# Patient Record
Sex: Female | Born: 1970 | Race: White | Hispanic: No | Marital: Married | Smoking: Never smoker
Health system: Southern US, Community
[De-identification: ages and names within clinical notes are randomized; demographics above are authoritative.]

## PROBLEM LIST (undated history)

## (undated) DIAGNOSIS — R51 Headache: Secondary | ICD-10-CM

## (undated) DIAGNOSIS — C801 Malignant (primary) neoplasm, unspecified: Secondary | ICD-10-CM

## (undated) DIAGNOSIS — F419 Anxiety disorder, unspecified: Secondary | ICD-10-CM

## (undated) DIAGNOSIS — R109 Unspecified abdominal pain: Secondary | ICD-10-CM

## (undated) DIAGNOSIS — H9319 Tinnitus, unspecified ear: Secondary | ICD-10-CM

## (undated) HISTORY — DX: Anxiety disorder, unspecified: F41.9

## (undated) HISTORY — PX: CHOLECYSTECTOMY: SHX55

---
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
## 2004-12-07 HISTORY — PX: OTHER SURGICAL HISTORY: SHX169

## 2008-07-12 ENCOUNTER — Ambulatory Visit (HOSPITAL_COMMUNITY): Admission: RE | Admit: 2008-07-12 | Discharge: 2008-07-12 | Payer: Self-pay | Admitting: Family Medicine

## 2009-02-04 ENCOUNTER — Encounter: Admission: RE | Admit: 2009-02-04 | Discharge: 2009-02-04 | Payer: Self-pay | Admitting: Family Medicine

## 2009-06-23 ENCOUNTER — Ambulatory Visit (HOSPITAL_COMMUNITY): Admission: RE | Admit: 2009-06-23 | Discharge: 2009-06-23 | Payer: Self-pay | Admitting: Obstetrics and Gynecology

## 2010-08-13 ENCOUNTER — Encounter: Payer: Self-pay | Admitting: Family Medicine

## 2010-10-13 ENCOUNTER — Emergency Department (HOSPITAL_COMMUNITY): Payer: Federal, State, Local not specified - PPO

## 2010-10-13 ENCOUNTER — Emergency Department (HOSPITAL_COMMUNITY)
Admission: EM | Admit: 2010-10-13 | Discharge: 2010-10-13 | Disposition: A | Discharge status: Home / Self Care | Payer: Federal, State, Local not specified - PPO | Attending: Emergency Medicine | Admitting: Emergency Medicine

## 2010-10-13 DIAGNOSIS — R209 Unspecified disturbances of skin sensation: Secondary | ICD-10-CM | POA: Insufficient documentation

## 2010-10-13 DIAGNOSIS — J329 Chronic sinusitis, unspecified: Secondary | ICD-10-CM | POA: Insufficient documentation

## 2010-10-13 DIAGNOSIS — R51 Headache: Secondary | ICD-10-CM | POA: Insufficient documentation

## 2010-10-13 LAB — URINALYSIS, ROUTINE W REFLEX MICROSCOPIC
Bilirubin Urine: NEGATIVE
Hgb urine dipstick: NEGATIVE
Nitrite: NEGATIVE
Specific Gravity, Urine: 1.008 (ref 1.005–1.030)
Urobilinogen, UA: 0.2 mg/dL (ref 0.0–1.0)

## 2010-10-13 LAB — APTT: aPTT: 30 seconds (ref 24–37)

## 2010-10-13 LAB — CBC
HCT: 36.7 % (ref 36.0–46.0)
MCHC: 33.5 g/dL (ref 30.0–36.0)
Platelets: 196 10*3/uL (ref 150–400)
RDW: 12.1 % (ref 11.5–15.5)
WBC: 10 10*3/uL (ref 4.0–10.5)

## 2010-10-13 LAB — POCT PREGNANCY, URINE: Preg Test, Ur: NEGATIVE

## 2010-10-13 LAB — DIFFERENTIAL
Basophils Absolute: 0 10*3/uL (ref 0.0–0.1)
Basophils Relative: 0 % (ref 0–1)
Eosinophils Relative: 0 % (ref 0–5)
Lymphocytes Relative: 12 % (ref 12–46)
Monocytes Absolute: 0.3 10*3/uL (ref 0.1–1.0)

## 2010-10-13 LAB — BASIC METABOLIC PANEL
BUN: 6 mg/dL (ref 6–23)
Calcium: 9.3 mg/dL (ref 8.4–10.5)
Creatinine, Ser: 0.55 mg/dL (ref 0.4–1.2)
GFR calc non Af Amer: >60 mL/min (ref >60)
Glucose, Bld: 102 mg/dL — ABNORMAL HIGH (ref 70–99)

## 2010-10-13 LAB — PROTIME-INR
INR: 1 (ref 0.00–1.49)
Prothrombin Time: 13.4 seconds (ref 11.6–15.2)

## 2010-10-24 LAB — CBC
HCT: 39.4 % (ref 36.0–46.0)
Hemoglobin: 13.2 g/dL (ref 12.0–15.0)
MCHC: 33.6 g/dL (ref 30.0–36.0)
MCV: 101.1 fL — ABNORMAL HIGH (ref 78.0–100.0)
Platelets: 202 K/uL (ref 150–400)
RBC: 3.9 MIL/uL (ref 3.87–5.11)
RDW: 12.6 % (ref 11.5–15.5)
WBC: 8.8 K/uL (ref 4.0–10.5)

## 2010-10-24 LAB — ABO/RH: ABO/RH(D): A POS

## 2013-12-21 ENCOUNTER — Other Ambulatory Visit: Payer: Self-pay | Admitting: Obstetrics and Gynecology

## 2014-01-01 ENCOUNTER — Ambulatory Visit: Payer: Federal, State, Local not specified - PPO | Attending: Gynecology | Admitting: Gynecology

## 2014-01-01 ENCOUNTER — Encounter: Payer: Self-pay | Admitting: Gynecology

## 2014-01-01 ENCOUNTER — Encounter (HOSPITAL_COMMUNITY): Payer: Self-pay | Admitting: Pharmacy Technician

## 2014-01-01 VITALS — BP 142/78 | HR 96 | Temp 98.5°F | Resp 20 | Ht 67.0 in | Wt 145.6 lb

## 2014-01-01 DIAGNOSIS — C539 Malignant neoplasm of cervix uteri, unspecified: Secondary | ICD-10-CM | POA: Insufficient documentation

## 2014-01-01 DIAGNOSIS — R1032 Left lower quadrant pain: Secondary | ICD-10-CM

## 2014-01-01 DIAGNOSIS — R1011 Right upper quadrant pain: Secondary | ICD-10-CM

## 2014-01-01 DIAGNOSIS — F411 Generalized anxiety disorder: Secondary | ICD-10-CM | POA: Insufficient documentation

## 2014-01-01 NOTE — Patient Instructions (Addendum)
You will have a Robotic assisted Radical Hysterectomy  By Dr. Alycia Rossetti on January 12, 2014                Preparing for your Surgery  Pre-operative Testing -You will receive a phone call from presurgical testing at [facility] to arrange for a pre-operative testing appointment before your surgery.  This appointment normally occurs one to two weeks before your scheduled surgery.   -Bring your insurance card, copy of an advanced directive if applicable, medication list  -At that visit, you will be asked to sign a consent for a possible blood transfusion in case a transfusion becomes necessary during surgery.  The need for a blood transfusion is rare but having consent is a necessary part of your care.     Day Before Surgery at [facility] will be asked to take in only clear liquids the day before surgery.  Examples of clear liquids include broths, jello, and clear juices.  You will be advised to have nothing to eat or drink after midnight the evening before.    Your role in recovery Your role is to become active as soon as directed by your doctor, while still giving yourself time to heal.  Rest when you feel tired. You will be asked to do the following in order to speed your recovery:  - Cough and breathe deeply. This helps toclear and expand your lungs and can prevent pneumonia. You may be given a spirometer to practice deep breathing. A staff member will show you how to use the spirometer. - Do mild physical activity. Walking or moving your legs help your circulation and body functions return to normal. A staff member will help you when you try to walk and will provide you with simple exercises. Do not try to get up or walk alone the first time. - Actively manage your pain. Managing your pain lets you move in comfort. We will ask you to rate your pain on a scale of zero to 10. It is your responsibility to tell your doctor or nurse where and how much you hurt so your pain can be  treated.  Special Considerations -If you are diabetic, you may be placed on insulin after surgery to have closer control over your blood sugars to promote healing and recovery.  This does not mean that you will be discharged on insulin.  If applicable, your oral antidiabetics will be resumed when you are tolerating a solid diet.  -Your final pathology results from surgery should be available by the Friday after surgery and the results will be relayed to you when available. Hysterectomy Information  A hysterectomy is a surgery in which your uterus is removed. This surgery may be done to treat various medical problems. After the surgery, you will no longer have menstrual periods. The surgery will also make you unable to become pregnant (sterile). The fallopian tubes and ovaries can be removed (bilateral salpingo-oophorectomy) during this surgery as well.  REASONS FOR A HYSTERECTOMY  Persistent, abnormal bleeding.  Lasting (chronic) pelvic pain or infection.  The lining of the uterus (endometrium) starts growing outside the uterus (endometriosis).  The endometrium starts growing in the muscle of the uterus (adenomyosis).  The uterus falls down into the vagina (pelvic organ prolapse).  Noncancerous growths in the uterus (uterine fibroids) that cause symptoms.  Precancerous cells.  Cervical cancer or uterine cancer. TYPES OF HYSTERECTOMIES  Supracervical hysterectomy In this type, the top part of the uterus is removed, but  not the cervix.  Total hysterectomy The uterus and cervix are removed.  Radical hysterectomy The uterus, the cervix, and the fibrous tissue that holds the uterus in place in the pelvis (parametrium) are removed. WAYS A HYSTERECTOMY CAN BE PERFORMED  Abdominal hysterectomy A large surgical cut (incision) is made in the abdomen. The uterus is removed through this incision.  Vaginal hysterectomy An incision is made in the vagina. The uterus is removed through this  incision. There are no abdominal incisions.  Conventional laparoscopic hysterectomy Three or four small incisions are made in the abdomen. A thin, lighted tube with a camera (laparoscope) is inserted into one of the incisions. Other tools are put through the other incisions. The uterus is cut into small pieces. The small pieces are removed through the incisions, or they are removed through the vagina.  Laparoscopically assisted vaginal hysterectomy (LAVH) Three or four small incisions are made in the abdomen. Part of the surgery is performed laparoscopically and part vaginally. The uterus is removed through the vagina.  Robot-assisted laparoscopic hysterectomy A laparoscope and other tools are inserted into 3 or 4 small incisions in the abdomen. A computer-controlled device is used to give the surgeon a 3D image and to help control the surgical instruments. This allows for more precise movements of surgical instruments. The uterus is cut into small pieces and removed through the incisions or removed through the vagina. RISKS AND COMPLICATIONS  Possible complications associated with this procedure include:  Bleeding and risk of blood transfusion. Tell your health care provider if you do not want to receive any blood products.  Blood clots in the legs or lung.  Infection.  Injury to surrounding organs.  Problems or side effects related to anesthesia.  Conversion to an abdominal hysterectomy from one of the other techniques. WHAT TO EXPECT AFTER A HYSTERECTOMY  You will be given pain medicine.  You will need to have someone with you for the first 3 5 days after you go home.  You will need to follow up with your surgeon in 2 4 weeks after surgery to evaluate your progress.  You may have early menopause symptoms such as hot flashes, night sweats, and insomnia.  If you had a hysterectomy for a problem that was not cancer or not a condition that could lead to cancer, then you no longer need  Pap tests. However, even if you no longer need a Pap test, a regular exam is a good idea to make sure no other problems are starting. Document Released: 01/02/2001 Document Revised: 04/29/2013 Document Reviewed: 03/16/2013 [facility] Patient Information 2014 [place].

## 2014-01-01 NOTE — Progress Notes (Signed)
Consult Note: Gyn-Onc   Diamond Calhoun 43 y.o. female  Chief Complaint  Patient presents with  . 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  Plan: Management stage IB 1 adenocarcinoma of cervix discussed with the patient and her husband. The pros and cons of surgery versus radiation therapy were outlined. The patient prefers to undergo surgery. We would recommend a robotic assisted laparoscopic radical hysterectomy, bilateral salpingectomy, and pelvic lymphadenectomy. The risks of surgery including hemorrhage, infection, injury to adjacent viscera, thromboembolic complications, and anesthetic risks were outlined. They understand that should intermediate or high-risk features be found on final pathology that postoperative pelvic radiation therapy may be advised.   While I think it unlikely that some of the patient's symptoms are associated with her early cervix cancer, to evaluate the patient's right upper quadrant pain and left lower quadrant pain we'll obtain a preoperative CT scan of the chest abdomen and pelvis.  Patient and her husband understand that Dr. Nancy Marus will be the primary surgeon.  HPI: 43 year old white female seen in consultation request of Dr. Everlene Farrier regarding management of a newly diagnosed adenocarcinoma of cervix (villoglandular). The patient presented with profuse clear vaginal discharge. Initial Pap smear showed atypical glandular cells. Colposcopy with directed biopsies revealed a lesion at the squamocolumnar junction (circumferential). Biopsies revealed adenocarcinoma villoglandular type.  Patient reports she has regular cyclic menses although the duration has become shortened and the frequency increased in recent years. She is not using any oral contraceptives or any other hormones.  Obstetrical history gravida 1 (cesarean section). She's had no other gynecologic surgery. 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   Vitals: Blood pressure 142/78, pulse 96, temperature 98.5 F (36.9 C), temperature source Oral, resp. rate 20, height 5\' 7"  (1.702 m), weight 145 lb 9.6 oz (66.044 kg).  Physical Exam: General : The patient is a healthy woman in no acute distress.  HEENT: normocephalic, extraoccular movements normal; neck is supple without thyromegally  Lynphnodes: Supraclavicular and inguinal nodes not enlarged  Abdomen: Soft, non-tender, no ascites, no organomegally, no masses, no hernias  Pelvic:  EGBUS: Normal female  Vagina: Normal, no lesions  Urethra and Bladder: Normal, non-tender  Cervix: Is normal in size. There is a exophytic lesion circumferentially at the cervical os. This measures approximately 1.5 cm in diameter. Uterus: Anterior normal shape size and consistency Bi-manual examination: Non-tender; no adenxal masses or nodularity  Rectal: normal sphincter tone, no masses, no blood , there is no parametrial involvement. Lower extremities: No edema or varicosities. Normal range of motion      No Known Allergies  Past Medical History  Diagnosis Date  . Anxiety     Past Surgical History  Procedure Laterality Date  . Benign tumor removed in 1988  1988    Parotid tumor removed   . Cholecystectomy    . C-secton  12/07/2004    Current Outpatient Prescriptions  Medication Sig Dispense Refill  . ALPRAZolam (XANAX) 0.25 MG tablet        No current facility-administered medications for this visit.    History   Social History  . Marital Status: Married    Spouse Name: N/A    Number of Children: N/A  . Years of Education: N/A  Occupational History  . Not on file.   Social History Main Topics  . Smoking status: Former Research scientist (life sciences)  . Smokeless tobacco: Not on file     Comment: " Smoked some in  college"    . Alcohol Use: Yes     Comment: " Occasionally 1-2 glasses a year"   . Drug Use: No  . Sexual Activity: Yes    Birth Control/ Protection: Condom   Other Topics Concern  . Not on file   Social History Narrative  . No narrative on file    Family History  Problem Relation Age of Onset  . Anemia Brother   . Cancer Paternal Aunt   . Cancer Maternal Grandmother   . Cancer Maternal Grandfather   . Cancer Paternal Grandfather       Alvino Chapel, MD 01/01/2014, 12:13 PM

## 2014-01-01 NOTE — Progress Notes (Signed)
Need pre op orders please---has pst appt 01/04/14    thanks

## 2014-01-04 ENCOUNTER — Encounter (HOSPITAL_COMMUNITY): Payer: Self-pay

## 2014-01-04 ENCOUNTER — Ambulatory Visit (HOSPITAL_COMMUNITY)
Admission: RE | Admit: 2014-01-04 | Discharge: 2014-01-04 | Disposition: A | Discharge status: Home / Self Care | Payer: Federal, State, Local not specified - PPO | Source: Ambulatory Visit | Attending: Gynecology | Admitting: Gynecology

## 2014-01-04 ENCOUNTER — Encounter (HOSPITAL_COMMUNITY)
Admission: RE | Admit: 2014-01-04 | Discharge: 2014-01-04 | Disposition: A | Discharge status: Home / Self Care | Payer: Federal, State, Local not specified - PPO | Source: Ambulatory Visit | Attending: Obstetrics & Gynecology | Admitting: Obstetrics & Gynecology

## 2014-01-04 DIAGNOSIS — C539 Malignant neoplasm of cervix uteri, unspecified: Secondary | ICD-10-CM

## 2014-01-04 DIAGNOSIS — N852 Hypertrophy of uterus: Secondary | ICD-10-CM | POA: Insufficient documentation

## 2014-01-04 DIAGNOSIS — N859 Noninflammatory disorder of uterus, unspecified: Secondary | ICD-10-CM | POA: Insufficient documentation

## 2014-01-04 HISTORY — DX: Headache: R51

## 2014-01-04 HISTORY — DX: Malignant (primary) neoplasm, unspecified: C80.1

## 2014-01-04 HISTORY — DX: Tinnitus, unspecified ear: H93.19

## 2014-01-04 HISTORY — DX: Unspecified abdominal pain: R10.9

## 2014-01-04 LAB — COMPREHENSIVE METABOLIC PANEL
ALK PHOS: 39 U/L (ref 39–117)
ALT: 12 U/L (ref 0–35)
AST: 16 U/L (ref 0–37)
Albumin: 4 g/dL (ref 3.5–5.2)
BILIRUBIN TOTAL: 0.3 mg/dL (ref 0.3–1.2)
BUN: 7 mg/dL (ref 6–23)
CHLORIDE: 103 meq/L (ref 96–112)
CO2: 27 mEq/L (ref 19–32)
Calcium: 9.3 mg/dL (ref 8.4–10.5)
Creatinine, Ser: 0.53 mg/dL (ref 0.50–1.10)
GFR calc Af Amer: >90 mL/min (ref >90)
Glucose, Bld: 99 mg/dL (ref 70–99)
Potassium: 4.2 mEq/L (ref 3.7–5.3)
Sodium: 140 mEq/L (ref 137–147)
TOTAL PROTEIN: 7 g/dL (ref 6.0–8.3)

## 2014-01-04 LAB — CBC WITH DIFFERENTIAL/PLATELET
Basophils Absolute: 0 10*3/uL (ref 0.0–0.1)
Basophils Relative: 0 % (ref 0–1)
EOS ABS: 0.1 10*3/uL (ref 0.0–0.7)
Eosinophils Relative: 1 % (ref 0–5)
HCT: 34.7 % — ABNORMAL LOW (ref 36.0–46.0)
Hemoglobin: 12.3 g/dL (ref 12.0–15.0)
LYMPHS ABS: 1.8 10*3/uL (ref 0.7–4.0)
Lymphocytes Relative: 26 % (ref 12–46)
MCH: 33.9 pg (ref 26.0–34.0)
MCHC: 35.4 g/dL (ref 30.0–36.0)
MCV: 95.6 fL (ref 78.0–100.0)
MONOS PCT: 5 % (ref 3–12)
Monocytes Absolute: 0.4 10*3/uL (ref 0.1–1.0)
Neutro Abs: 4.6 10*3/uL (ref 1.7–7.7)
Neutrophils Relative %: 68 % (ref 43–77)
Platelets: 188 10*3/uL (ref 150–400)
RBC: 3.63 MIL/uL — AB (ref 3.87–5.11)
RDW: 12.2 % (ref 11.5–15.5)
WBC: 6.8 10*3/uL (ref 4.0–10.5)

## 2014-01-04 LAB — URINALYSIS, ROUTINE W REFLEX MICROSCOPIC
BILIRUBIN URINE: NEGATIVE
Glucose, UA: NEGATIVE mg/dL
HGB URINE DIPSTICK: NEGATIVE
KETONES UR: NEGATIVE mg/dL
Leukocytes, UA: NEGATIVE
Nitrite: NEGATIVE
PROTEIN: NEGATIVE mg/dL
Specific Gravity, Urine: 1.022 (ref 1.005–1.030)
UROBILINOGEN UA: 0.2 mg/dL (ref 0.0–1.0)
pH: 8 (ref 5.0–8.0)

## 2014-01-04 LAB — PREGNANCY, URINE: PREG TEST UR: NEGATIVE

## 2014-01-04 MED ORDER — IOHEXOL 300 MG/ML  SOLN
100.0000 mL | Freq: Once | INTRAMUSCULAR | Status: AC | PRN
  Administered 2014-01-04: 100 mL via INTRAVENOUS

## 2014-01-04 NOTE — Patient Instructions (Addendum)
Diamond Calhoun  01/04/2014                           YOUR PROCEDURE IS SCHEDULED ON: 01/12/14 AT 2:15 PM               ENTER THRU [facility] ENTRANCE AND                           FOLLOW  SIGNS TO SHORT STAY CENTER                 ARRIVE AT SHORT STAY AT: 12:15 PM               CALL THIS NUMBER IF ANY PROBLEMS THE DAY OF SURGERY :               [phone]                                REMEMBER:   Do not eat food  AFTER MIDNIGHT              CLEAR LIQUIDS ONLY DAY BEFORE SURGERY   May have clear liquids UNTIL 6 HOURS BEFORE SURGERY  (8:00 AM)               Take these medicines the morning of surgery with               A SIPS OF WATER :     XANAX IF NEEDED    Do not wear jewelry, make-up   Do not wear lotions, powders, or perfumes.   Do not shave legs or underarms 12 hrs. before surgery (men may shave face)  Do not bring valuables to the hospital.  Contacts, dentures or bridgework may not be worn into surgery.  Leave suitcase in the car. After surgery it may be brought to your room.  For patients admitted to the hospital more than one night, checkout time is            11:00 AM                                                            ________________________________________________________________________                                                                        [place]  Before surgery, you can play an important role.  Because skin is not sterile, your skin needs to be as free of germs as possible.  You can reduce the number of germs on your skin by washing with CHG (chlorahexidine gluconate) soap before surgery.  CHG is an antiseptic cleaner which kills germs and bonds with the skin to continue killing germs even after washing. Please DO NOT use if you have an allergy to CHG or antibacterial soaps.  If your skin becomes reddened/irritated stop using  the CHG and inform your nurse when you arrive at  Short Stay. Do not shave (including legs and underarms) for at least 48 hours prior to the first CHG shower.  You may shave your face. Please follow these instructions carefully:   1.  Shower with CHG Soap the night before surgery and the  morning of Surgery.   2.  If you choose to wash your hair, wash your hair first as usual with your  normal  Shampoo.   3.  After you shampoo, rinse your hair and body thoroughly to remove the  shampoo.                                         4.  Use CHG as you would any other liquid soap.  You can apply chg directly  to the skin and wash . Gently wash with scrungie or clean wascloth    5.  Apply the CHG Soap to your body ONLY FROM THE NECK DOWN.   Do not use on open                           Wound or open sores. Avoid contact with eyes, ears mouth and genitals (private parts).                        Genitals (private parts) with your normal soap.              6.  Wash thoroughly, paying special attention to the area where your surgery  will be performed.   7.  Thoroughly rinse your body with warm water from the neck down.   8.  DO NOT shower/wash with your normal soap after using and rinsing off  the CHG Soap .                9.  Pat yourself dry with a clean towel.             10.  Wear clean pajamas.             11.  Place clean sheets on your bed the night of your first shower and do not  sleep with pets.  Day of Surgery : Do not apply any lotions/deodorants the morning of surgery.  Please wear clean clothes to the hospital/surgery center.  FAILURE TO FOLLOW THESE INSTRUCTIONS MAY RESULT IN THE CANCELLATION OF YOUR SURGERY    PATIENT SIGNATURE_________________________________      CLEAR LIQUID DIET   Foods Allowed                                                                     Foods Excluded  Coffee and tea, regular and decaf                             liquids that you cannot  Plain Jell-O in any flavor  see through such as: Fruit ices (not with fruit pulp)                                     milk, soups, orange juice  Iced Popsicles                                    All solid food Carbonated beverages, regular and diet                                    Cranberry, grape and apple juices Sports drinks like Gatorade Lightly seasoned clear broth or consume(fat free) Sugar, honey syrup  _____________________________________________________________________    Incentive Spirometer  An incentive spirometer is a tool that can help keep your lungs clear and active. This tool measures how well you are filling your lungs with each breath. Taking long deep breaths may help reverse or decrease the chance of developing breathing (pulmonary) problems (especially infection) following:  A long period of time when you are unable to move or be active. BEFORE THE PROCEDURE   If the spirometer includes an indicator to show your best effort, your nurse or respiratory therapist will set it to a desired goal.  If possible, sit up straight or lean slightly forward. Try not to slouch.  Hold the incentive spirometer in an upright position. INSTRUCTIONS FOR USE  1. Sit on the edge of your bed if possible, or sit up as far as you can in bed or on a chair. 2. Hold the incentive spirometer in an upright position. 3. Breathe out normally. 4. Place the mouthpiece in your mouth and seal your lips tightly around it. 5. Breathe in slowly and as deeply as possible, raising the piston or the ball toward the top of the column. 6. Hold your breath for 3-5 seconds or for as long as possible. Allow the piston or ball to fall to the bottom of the column. 7. Remove the mouthpiece from your mouth and breathe out normally. 8. Rest for a few seconds and repeat Steps 1 through 7 at least 10 times every 1-2 hours when you are awake. Take your time and take a few normal breaths between deep breaths. 9. The  spirometer may include an indicator to show your best effort. Use the indicator as a goal to work toward during each repetition. 10. After each set of 10 deep breaths, practice coughing to be sure your lungs are clear. If you have an incision (the cut made at the time of surgery), support your incision when coughing by placing a pillow or rolled up towels firmly against it. Once you are able to get out of bed, walk around indoors and cough well. You may stop using the incentive spirometer when instructed by your caregiver.  RISKS AND COMPLICATIONS  Take your time so you do not get dizzy or light-headed.  If you are in pain, you may need to take or ask for pain medication before doing incentive spirometry. It is harder to take a deep breath if you are having pain. AFTER USE  Rest and breathe slowly and easily.  It can be helpful to keep track of a log of your progress. Your caregiver can provide you with a simple table to help  with this. If you are using the spirometer at home, follow these instructions: [place] IF:   You are having difficultly using the spirometer.  You have trouble using the spirometer as often as instructed.  Your pain medication is not giving enough relief while using the spirometer.  You develop fever of 100.5 F (38.1 C) or higher. SEEK IMMEDIATE MEDICAL CARE IF:   You cough up bloody sputum that had not been present before.  You develop fever of 102 F (38.9 C) or greater.  You develop worsening pain at or near the incision site. MAKE SURE YOU:   Understand these instructions.  Will watch your condition.  Will get help right away if you are not doing well or get worse. Document Released: 11/19/2006 Document Revised: 10/01/2011 Document Reviewed: 01/20/2007 [employer] Patient Information 2014 [employer], [place].   ________________________________________________________________________  WHAT IS A BLOOD TRANSFUSION? Blood Transfusion  Information  A transfusion is the replacement of blood or some of its parts. Blood is made up of multiple cells which provide different functions.  Red blood cells carry oxygen and are used for blood loss replacement.  White blood cells fight against infection.  Platelets control bleeding.  Plasma helps clot blood.  Other blood products are available for specialized needs, such as hemophilia or other clotting disorders. BEFORE THE TRANSFUSION  Who gives blood for transfusions?   Healthy volunteers who are fully evaluated to make sure their blood is safe. This is blood bank blood. Transfusion therapy is the safest it has ever been in the practice of medicine. Before blood is taken from a donor, a complete history is taken to make sure that person has no history of diseases nor engages in risky social behavior (examples are intravenous drug use or sexual activity with multiple partners). The donor's travel history is screened to minimize risk of transmitting infections, such as malaria. The donated blood is tested for signs of infectious diseases, such as HIV and hepatitis. The blood is then tested to be sure it is compatible with you in order to minimize the chance of a transfusion reaction. If you or a relative donates blood, this is often done in anticipation of surgery and is not appropriate for emergency situations. It takes many days to process the donated blood. RISKS AND COMPLICATIONS Although transfusion therapy is very safe and saves many lives, the main dangers of transfusion include:   Getting an infectious disease.  Developing a transfusion reaction. This is an allergic reaction to something in the blood you were given. Every precaution is taken to prevent this. The decision to have a blood transfusion has been considered carefully by your caregiver before blood is given. Blood is not given unless the benefits outweigh the risks. AFTER THE TRANSFUSION  Right after receiving a  blood transfusion, you will usually feel much better and more energetic. This is especially true if your red blood cells have gotten low (anemic). The transfusion raises the level of the red blood cells which carry oxygen, and this usually causes an energy increase.  The nurse administering the transfusion will monitor you carefully for complications. HOME CARE INSTRUCTIONS  No special instructions are needed after a transfusion. You may find your energy is better. Speak with your caregiver about any limitations on activity for underlying diseases you may have. SEEK MEDICAL CARE IF:   Your condition is not improving after your transfusion.  You develop redness or irritation at the intravenous (IV) site. SEEK IMMEDIATE MEDICAL CARE IF:  Any of the following symptoms occur over the next 12 hours:  Shaking chills.  You have a temperature by mouth above 102 F (38.9 C), not controlled by medicine.  Chest, back, or muscle pain.  People around you feel you are not acting correctly or are confused.  Shortness of breath or difficulty breathing.  Dizziness and fainting.  You get a rash or develop hives.  You have a decrease in urine output.  Your urine turns a dark color or changes to pink, red, or brown. Any of the following symptoms occur over the next 10 days:  You have a temperature by mouth above 102 F (38.9 C), not controlled by medicine.  Shortness of breath.  Weakness after normal activity.  The white part of the eye turns yellow (jaundice).  You have a decrease in the amount of urine or are urinating less often.  Your urine turns a dark color or changes to pink, red, or brown. Document Released: 07/06/2000 Document Revised: 10/01/2011 Document Reviewed: 02/23/2008 [facility] Patient Information 2014 [place], [place].  _______________________________________________________________________

## 2014-01-05 ENCOUNTER — Telehealth (INDEPENDENT_AMBULATORY_CARE_PROVIDER_SITE_OTHER): Payer: Self-pay | Admitting: Gynecologic Oncology

## 2014-01-05 NOTE — Telephone Encounter (Signed)
TC to patient. We discussed her CT scan results. She was very pleased. We discussed the fibroids and that at the time of either EUA or dx laparoscopy, the uterus was felt to be too big for MIS radical hysterectomy, that we would proceed with surgery via a low transverse incision. She is fine with that. We discussed ovarian preservation but removal of the fallopian tubes. She understands that there is a small risk of ovarian involvement with adenoca of the cervix but that the benefits to maintaining ovarian function at her age outweight those risks. If there are risk factors at the time of her final pathology, we can always remove the ovaries. She agrees. Her questions were answered and she is prepared for surgery next week. PG

## 2014-01-07 NOTE — Progress Notes (Signed)
PT NOTIFIED OF TIME CHANGE TO 3:00 PM - INSTRUCTED TO ARRIVE AT 1:00 PM - MAY HAVE CLEAR LIQUIDS UNTIL 7:00 AM

## 2014-01-12 ENCOUNTER — Encounter (HOSPITAL_COMMUNITY): Payer: Federal, State, Local not specified - PPO | Admitting: Certified Registered"

## 2014-01-12 ENCOUNTER — Encounter (HOSPITAL_COMMUNITY)
Admission: RE | Disposition: A | Discharge status: Home / Self Care | Payer: Self-pay | Source: Ambulatory Visit | Attending: Gynecologic Oncology

## 2014-01-12 ENCOUNTER — Inpatient Hospital Stay (HOSPITAL_COMMUNITY)
Admission: RE | Admit: 2014-01-12 | Discharge: 2014-01-14 | DRG: 735 | Disposition: A | Discharge status: Home / Self Care | Payer: Federal, State, Local not specified - PPO | Source: Ambulatory Visit | Attending: Gynecologic Oncology | Admitting: Gynecologic Oncology

## 2014-01-12 ENCOUNTER — Ambulatory Visit (HOSPITAL_COMMUNITY): Payer: Federal, State, Local not specified - PPO | Admitting: Certified Registered"

## 2014-01-12 ENCOUNTER — Encounter (HOSPITAL_COMMUNITY): Payer: Self-pay | Admitting: *Deleted

## 2014-01-12 DIAGNOSIS — C539 Malignant neoplasm of cervix uteri, unspecified: Secondary | ICD-10-CM

## 2014-01-12 DIAGNOSIS — F411 Generalized anxiety disorder: Secondary | ICD-10-CM | POA: Diagnosis present

## 2014-01-12 DIAGNOSIS — R110 Nausea: Secondary | ICD-10-CM | POA: Diagnosis not present

## 2014-01-12 DIAGNOSIS — Z79899 Other long term (current) drug therapy: Secondary | ICD-10-CM

## 2014-01-12 HISTORY — PX: ROBOTIC ASSISTED TOTAL HYSTERECTOMY WITH BILATERAL SALPINGO OOPHERECTOMY: SHX6086

## 2014-01-12 LAB — TYPE AND SCREEN
ABO/RH(D): A POS
Antibody Screen: NEGATIVE

## 2014-01-12 LAB — ABO/RH: ABO/RH(D): A POS

## 2014-01-12 SURGERY — ROBOTIC ASSISTED TOTAL HYSTERECTOMY WITH BILATERAL SALPINGO OOPHORECTOMY
Anesthesia: General

## 2014-01-12 MED ORDER — NAPHAZOLINE HCL 0.1 % OP SOLN
1.0000 [drp] | OPHTHALMIC | Status: DC | PRN
  Filled 2014-01-12: qty 15

## 2014-01-12 MED ORDER — MIDAZOLAM HCL 2 MG/2ML IJ SOLN
INTRAMUSCULAR | Status: AC
  Filled 2014-01-12: qty 2

## 2014-01-12 MED ORDER — HYDROMORPHONE HCL PF 1 MG/ML IJ SOLN
INTRAMUSCULAR | Status: AC
  Filled 2014-01-12: qty 1

## 2014-01-12 MED ORDER — GLYCOPYRROLATE 0.2 MG/ML IJ SOLN
INTRAMUSCULAR | Status: AC
  Filled 2014-01-12: qty 3

## 2014-01-12 MED ORDER — ALPRAZOLAM 0.25 MG PO TABS: 0.1250 mg | ORAL_TABLET | Freq: Two times a day (BID) | ORAL | Status: DC | PRN

## 2014-01-12 MED ORDER — ROCURONIUM BROMIDE 100 MG/10ML IV SOLN
INTRAVENOUS | Status: AC
  Filled 2014-01-12: qty 1

## 2014-01-12 MED ORDER — PROPOFOL 10 MG/ML IV BOLUS
INTRAVENOUS | Status: DC | PRN
  Administered 2014-01-12: 150 mg via INTRAVENOUS

## 2014-01-12 MED ORDER — DEXAMETHASONE SODIUM PHOSPHATE 10 MG/ML IJ SOLN
INTRAMUSCULAR | Status: DC | PRN
  Administered 2014-01-12: 10 mg via INTRAVENOUS

## 2014-01-12 MED ORDER — GLYCOPYRROLATE 0.2 MG/ML IJ SOLN
INTRAMUSCULAR | Status: DC | PRN
  Administered 2014-01-12: .5 mg via INTRAVENOUS

## 2014-01-12 MED ORDER — STERILE WATER FOR IRRIGATION IR SOLN
Status: DC | PRN
  Administered 2014-01-12: 3000 mL

## 2014-01-12 MED ORDER — ENOXAPARIN SODIUM 40 MG/0.4ML ~~LOC~~ SOLN
40.0000 mg | SUBCUTANEOUS | Status: AC
  Administered 2014-01-12: 40 mg via SUBCUTANEOUS
  Filled 2014-01-12: qty 0.4

## 2014-01-12 MED ORDER — FENTANYL CITRATE 0.05 MG/ML IJ SOLN
INTRAMUSCULAR | Status: AC
  Filled 2014-01-12: qty 5

## 2014-01-12 MED ORDER — FENTANYL CITRATE 0.05 MG/ML IJ SOLN
INTRAMUSCULAR | Status: DC | PRN
Start: 2014-01-12 — End: 2014-01-12
  Administered 2014-01-12: 50 ug via INTRAVENOUS
  Administered 2014-01-12: 100 ug via INTRAVENOUS
  Administered 2014-01-12 (×2): 50 ug via INTRAVENOUS

## 2014-01-12 MED ORDER — MEPERIDINE HCL 50 MG/ML IJ SOLN
INTRAMUSCULAR | Status: AC
  Filled 2014-01-12: qty 1

## 2014-01-12 MED ORDER — PROPOFOL 10 MG/ML IV BOLUS
INTRAVENOUS | Status: AC
  Filled 2014-01-12: qty 20

## 2014-01-12 MED ORDER — LACTATED RINGERS IV SOLN: INTRAVENOUS | Status: DC

## 2014-01-12 MED ORDER — SUCCINYLCHOLINE CHLORIDE 20 MG/ML IJ SOLN
INTRAMUSCULAR | Status: DC | PRN
  Administered 2014-01-12: 80 mg via INTRAVENOUS

## 2014-01-12 MED ORDER — ONDANSETRON HCL 4 MG/2ML IJ SOLN
4.0000 mg | Freq: Four times a day (QID) | INTRAMUSCULAR | Status: DC | PRN
  Administered 2014-01-12 – 2014-01-13 (×2): 4 mg via INTRAVENOUS
  Filled 2014-01-12: qty 2

## 2014-01-12 MED ORDER — NEOSTIGMINE METHYLSULFATE 10 MG/10ML IV SOLN
INTRAVENOUS | Status: AC
  Filled 2014-01-12: qty 1

## 2014-01-12 MED ORDER — MEPERIDINE HCL 50 MG/ML IJ SOLN
6.2500 mg | INTRAMUSCULAR | Status: DC | PRN
  Administered 2014-01-12: 12.5 mg via INTRAVENOUS

## 2014-01-12 MED ORDER — OXYCODONE-ACETAMINOPHEN 5-325 MG PO TABS: 1.0000 | ORAL_TABLET | ORAL | Status: DC | PRN

## 2014-01-12 MED ORDER — LACTATED RINGERS IV SOLN
INTRAVENOUS | Status: DC
  Administered 2014-01-12: 1000 mL via INTRAVENOUS

## 2014-01-12 MED ORDER — CEFAZOLIN SODIUM-DEXTROSE 2-3 GM-% IV SOLR
2.0000 g | INTRAVENOUS | Status: AC
  Administered 2014-01-12: 2 g via INTRAVENOUS

## 2014-01-12 MED ORDER — ONDANSETRON HCL 4 MG/2ML IJ SOLN
INTRAMUSCULAR | Status: DC | PRN
  Administered 2014-01-12: 4 mg via INTRAVENOUS

## 2014-01-12 MED ORDER — HYDROMORPHONE HCL PF 2 MG/ML IJ SOLN
INTRAMUSCULAR | Status: AC
  Filled 2014-01-12: qty 1

## 2014-01-12 MED ORDER — KETOROLAC TROMETHAMINE 30 MG/ML IJ SOLN
INTRAMUSCULAR | Status: AC
  Filled 2014-01-12: qty 1

## 2014-01-12 MED ORDER — KETOROLAC TROMETHAMINE 30 MG/ML IJ SOLN
30.0000 mg | Freq: Four times a day (QID) | INTRAMUSCULAR | Status: AC
Start: 2014-01-12 — End: 2014-01-13
  Filled 2014-01-12 (×3): qty 1

## 2014-01-12 MED ORDER — ROCURONIUM BROMIDE 100 MG/10ML IV SOLN
INTRAVENOUS | Status: DC | PRN
  Administered 2014-01-12 (×2): 10 mg via INTRAVENOUS
  Administered 2014-01-12: 30 mg via INTRAVENOUS
  Administered 2014-01-12: 5 mg via INTRAVENOUS
  Administered 2014-01-12: 10 mg via INTRAVENOUS

## 2014-01-12 MED ORDER — NEOSTIGMINE METHYLSULFATE 10 MG/10ML IV SOLN
INTRAVENOUS | Status: DC | PRN
  Administered 2014-01-12: 3.5 mg via INTRAVENOUS

## 2014-01-12 MED ORDER — CEFAZOLIN SODIUM-DEXTROSE 2-3 GM-% IV SOLR
INTRAVENOUS | Status: AC
  Filled 2014-01-12: qty 50

## 2014-01-12 MED ORDER — PROMETHAZINE HCL 25 MG/ML IJ SOLN: 6.2500 mg | INTRAMUSCULAR | Status: DC | PRN

## 2014-01-12 MED ORDER — KCL IN DEXTROSE-NACL 20-5-0.45 MEQ/L-%-% IV SOLN
INTRAVENOUS | Status: DC
  Administered 2014-01-12 – 2014-01-13 (×2): via INTRAVENOUS
  Filled 2014-01-12 (×4): qty 1000

## 2014-01-12 MED ORDER — MIDAZOLAM HCL 5 MG/5ML IJ SOLN
INTRAMUSCULAR | Status: DC | PRN
Start: 2014-01-12 — End: 2014-01-12
  Administered 2014-01-12: 2 mg via INTRAVENOUS

## 2014-01-12 MED ORDER — ONDANSETRON HCL 4 MG PO TABS: 4.0000 mg | ORAL_TABLET | Freq: Four times a day (QID) | ORAL | Status: DC | PRN

## 2014-01-12 MED ORDER — ONDANSETRON HCL 4 MG/2ML IJ SOLN
INTRAMUSCULAR | Status: AC
  Filled 2014-01-12: qty 2

## 2014-01-12 MED ORDER — HYDROMORPHONE HCL PF 1 MG/ML IJ SOLN
INTRAMUSCULAR | Status: DC | PRN
  Administered 2014-01-12 (×4): .4 mg via INTRAVENOUS

## 2014-01-12 MED ORDER — LACTATED RINGERS IR SOLN
Status: DC | PRN
  Administered 2014-01-12: 1000 mL

## 2014-01-12 MED ORDER — KETOROLAC TROMETHAMINE 30 MG/ML IJ SOLN
30.0000 mg | Freq: Four times a day (QID) | INTRAMUSCULAR | Status: AC
Start: 2014-01-12 — End: 2014-01-13
  Administered 2014-01-12 – 2014-01-13 (×4): 30 mg via INTRAVENOUS
  Filled 2014-01-12: qty 1
  Filled 2014-01-12: qty 2
  Filled 2014-01-12: qty 1
  Filled 2014-01-12: qty 2
  Filled 2014-01-12: qty 1

## 2014-01-12 MED ORDER — KCL IN DEXTROSE-NACL 20-5-0.45 MEQ/L-%-% IV SOLN
INTRAVENOUS | Status: AC
  Filled 2014-01-12: qty 1000

## 2014-01-12 MED ORDER — LIDOCAINE HCL (PF) 2 % IJ SOLN
INTRAMUSCULAR | Status: DC | PRN
  Administered 2014-01-12: 20 mg via INTRADERMAL

## 2014-01-12 MED ORDER — HYDROMORPHONE HCL PF 1 MG/ML IJ SOLN
0.2500 mg | INTRAMUSCULAR | Status: DC | PRN
Start: 2014-01-12 — End: 2014-01-12
  Administered 2014-01-12 (×4): 0.5 mg via INTRAVENOUS

## 2014-01-12 MED ORDER — DEXAMETHASONE SODIUM PHOSPHATE 10 MG/ML IJ SOLN
INTRAMUSCULAR | Status: AC
  Filled 2014-01-12: qty 1

## 2014-01-12 SURGICAL SUPPLY — 60 items
BENZOIN TINCTURE PRP APPL 2/3 (GAUZE/BANDAGES/DRESSINGS) ×3 IMPLANT
CABLE HIGH FREQUENCY MONO STRZ (ELECTRODE) ×3 IMPLANT
CHLORAPREP W/TINT 26ML (MISCELLANEOUS) ×3 IMPLANT
CLOSURE WOUND 1/2 X4 (GAUZE/BANDAGES/DRESSINGS) ×2
CORDS BIPOLAR (ELECTRODE) ×3 IMPLANT
COVER MAYO STAND STRL (DRAPES) IMPLANT
COVER SURGICAL LIGHT HANDLE (MISCELLANEOUS) ×3 IMPLANT
COVER TIP SHEARS 8 DVNC (MISCELLANEOUS) ×1 IMPLANT
COVER TIP SHEARS 8MM DA VINCI (MISCELLANEOUS) ×2
DRAPE LG THREE QUARTER DISP (DRAPES) ×6 IMPLANT
DRAPE SURG IRRIG POUCH 19X23 (DRAPES) IMPLANT
DRAPE TABLE BACK 44X90 PK DISP (DRAPES) ×6 IMPLANT
DRAPE UTILITY XL STRL (DRAPES) IMPLANT
DRAPE WARM FLUID 44X44 (DRAPE) ×3 IMPLANT
DRSG TEGADERM 2-3/8X2-3/4 SM (GAUZE/BANDAGES/DRESSINGS) ×15 IMPLANT
DRSG TEGADERM 4X4.75 (GAUZE/BANDAGES/DRESSINGS) IMPLANT
DRSG TEGADERM 6X8 (GAUZE/BANDAGES/DRESSINGS) IMPLANT
DRSG TEGADERM 8X12 (GAUZE/BANDAGES/DRESSINGS) ×3 IMPLANT
ELECT REM PT RETURN 9FT ADLT (ELECTROSURGICAL) ×3
ELECTRODE REM PT RTRN 9FT ADLT (ELECTROSURGICAL) ×1 IMPLANT
GAUZE SPONGE 2X2 8PLY STRL LF (GAUZE/BANDAGES/DRESSINGS) ×1 IMPLANT
GLOVE BIO SURGEON STRL SZ 6.5 (GLOVE) ×16 IMPLANT
GLOVE BIO SURGEONS STRL SZ 6.5 (GLOVE) ×8
GLOVE BIOGEL PI IND STRL 7.0 (GLOVE) ×2 IMPLANT
GLOVE BIOGEL PI INDICATOR 7.0 (GLOVE) ×4
GOWN STRL REUS W/ TWL XL LVL3 (GOWN DISPOSABLE) ×1 IMPLANT
GOWN STRL REUS W/TWL LRG LVL3 (GOWN DISPOSABLE) ×15 IMPLANT
GOWN STRL REUS W/TWL XL LVL3 (GOWN DISPOSABLE) ×2
HOLDER FOLEY CATH W/STRAP (MISCELLANEOUS) ×3 IMPLANT
KIT ACCESSORY DA VINCI DISP (KITS) ×2
KIT ACCESSORY DVNC DISP (KITS) ×1 IMPLANT
KIT BASIN OR (CUSTOM PROCEDURE TRAY) ×3 IMPLANT
MANIPULATOR UTERINE 4.5 ZUMI (MISCELLANEOUS) ×3 IMPLANT
MARKER SKIN DUAL TIP RULER LAB (MISCELLANEOUS) ×3 IMPLANT
OCCLUDER COLPOPNEUMO (BALLOONS) ×3 IMPLANT
POUCH SPECIMEN RETRIEVAL 10MM (ENDOMECHANICALS) ×6 IMPLANT
SET TUBE IRRIG SUCTION NO TIP (IRRIGATION / IRRIGATOR) ×3 IMPLANT
SHEET LAVH (DRAPES) ×3 IMPLANT
SOLUTION ANTI FOG 6CC (MISCELLANEOUS) ×3 IMPLANT
SOLUTION ELECTROLUBE (MISCELLANEOUS) ×3 IMPLANT
SPONGE GAUZE 2X2 STER 10/PKG (GAUZE/BANDAGES/DRESSINGS) ×2
SPONGE LAP 18X18 X RAY DECT (DISPOSABLE) IMPLANT
STRIP CLOSURE SKIN 1/2X4 (GAUZE/BANDAGES/DRESSINGS) ×4 IMPLANT
SUT VIC AB 0 CT1 27 (SUTURE) ×8
SUT VIC AB 0 CT1 27XBRD ANTBC (SUTURE) ×4 IMPLANT
SUT VIC AB 1 CT1 27 (SUTURE) ×2
SUT VIC AB 1 CT1 27XBRD ANTBC (SUTURE) ×1 IMPLANT
SUT VIC AB 4-0 PS2 27 (SUTURE) ×6 IMPLANT
SUT VICRYL 0 UR6 27IN ABS (SUTURE) ×3 IMPLANT
SYR BULB IRRIGATION 50ML (SYRINGE) IMPLANT
SYRINGE 60CC LL (MISCELLANEOUS) ×3 IMPLANT
TOWEL OR 17X26 10 PK STRL BLUE (TOWEL DISPOSABLE) ×6 IMPLANT
TOWEL OR NON WOVEN STRL DISP B (DISPOSABLE) ×3 IMPLANT
TRAP SPECIMEN MUCOUS 40CC (MISCELLANEOUS) IMPLANT
TRAY FOLEY CATH 14FRSI W/METER (CATHETERS) ×3 IMPLANT
TRAY LAP CHOLE (CUSTOM PROCEDURE TRAY) ×3 IMPLANT
TROCAR 12M 150ML BLUNT (TROCAR) ×3 IMPLANT
TROCAR BLADELESS OPT 5 100 (ENDOMECHANICALS) ×3 IMPLANT
TROCAR XCEL 12X100 BLDLESS (ENDOMECHANICALS) ×3 IMPLANT
TUBING INSUFFLATION 10FT LAP (TUBING) ×3 IMPLANT

## 2014-01-12 NOTE — Anesthesia Preprocedure Evaluation (Addendum)
Anesthesia Evaluation  Patient identified by MRN, date of birth, ID band Patient awake    Reviewed: Allergy & Precautions, H&P , NPO status , Patient's Chart, lab work & pertinent test results  Airway Mallampati: II TM Distance: >3 FB Neck ROM: Full    Dental  (+) Teeth Intact, Dental Advisory Given   Pulmonary neg pulmonary ROS,  breath sounds clear to auscultation  Pulmonary exam normal       Cardiovascular negative cardio ROS  Rhythm:Regular Rate:Normal     Neuro/Psych negative neurological ROS  negative psych ROS   GI/Hepatic negative GI ROS, Neg liver ROS,   Endo/Other  negative endocrine ROS  Renal/GU negative Renal ROS  negative genitourinary   Musculoskeletal negative musculoskeletal ROS (+)   Abdominal   Peds negative pediatric ROS (+)  Hematology negative hematology ROS (+)   Anesthesia Other Findings   Reproductive/Obstetrics negative OB ROS                          Anesthesia Physical Anesthesia Plan  ASA: II  Anesthesia Plan: General   Post-op Pain Management:    Induction: Intravenous  Airway Management Planned: Oral ETT  Additional Equipment:   Intra-op Plan:   Post-operative Plan: Extubation in OR  Informed Consent: I have reviewed the patients History and Physical, chart, labs and discussed the procedure including the risks, benefits and alternatives for the proposed anesthesia with the patient or authorized representative who has indicated his/her understanding and acceptance.   Dental advisory given  Plan Discussed with: CRNA  Anesthesia Plan Comments:         Anesthesia Quick Evaluation

## 2014-01-12 NOTE — H&P (View-Only) (Signed)
Consult Note: Gyn-Onc   Guy A Szwed 43 y.o. female  Chief Complaint  Patient presents with  . 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  Plan: Management stage IB 1 adenocarcinoma of cervix discussed with the patient and her husband. The pros and cons of surgery versus radiation therapy were outlined. The patient prefers to undergo surgery. We would recommend a robotic assisted laparoscopic radical hysterectomy, bilateral salpingectomy, and pelvic lymphadenectomy. The risks of surgery including hemorrhage, infection, injury to adjacent viscera, thromboembolic complications, and anesthetic risks were outlined. They understand that should intermediate or high-risk features be found on final pathology that postoperative pelvic radiation therapy may be advised.   While I think it unlikely that some of the patient's symptoms are associated with her early cervix cancer, to evaluate the patient's right upper quadrant pain and left lower quadrant pain we'll obtain a preoperative CT scan of the chest abdomen and pelvis.  Patient and her husband understand that Dr. Nancy Marus will be the primary surgeon.  HPI: 43 year old white female seen in consultation request of Dr. Everlene Farrier regarding management of a newly diagnosed adenocarcinoma of cervix (villoglandular). The patient presented with profuse clear vaginal discharge. Initial Pap smear showed atypical glandular cells. Colposcopy with directed biopsies revealed a lesion at the squamocolumnar junction (circumferential). Biopsies revealed adenocarcinoma villoglandular type.  Patient reports she has regular cyclic menses although the duration has become shortened and the frequency increased in recent years. She is not using any oral contraceptives or any other hormones.  Obstetrical history gravida 1 (cesarean section). She's had no other gynecologic surgery. 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   Vitals: Blood pressure 142/78, pulse 96, temperature 98.5 F (36.9 C), temperature source Oral, resp. rate 20, height 5\' 7"  (1.702 m), weight 145 lb 9.6 oz (66.044 kg).  Physical Exam: General : The patient is a healthy woman in no acute distress.  HEENT: normocephalic, extraoccular movements normal; neck is supple without thyromegally  Lynphnodes: Supraclavicular and inguinal nodes not enlarged  Abdomen: Soft, non-tender, no ascites, no organomegally, no masses, no hernias  Pelvic:  EGBUS: Normal female  Vagina: Normal, no lesions  Urethra and Bladder: Normal, non-tender  Cervix: Is normal in size. There is a exophytic lesion circumferentially at the cervical os. This measures approximately 1.5 cm in diameter. Uterus: Anterior normal shape size and consistency Bi-manual examination: Non-tender; no adenxal masses or nodularity  Rectal: normal sphincter tone, no masses, no blood , there is no parametrial involvement. Lower extremities: No edema or varicosities. Normal range of motion      No Known Allergies  Past Medical History  Diagnosis Date  . Anxiety     Past Surgical History  Procedure Laterality Date  . Benign tumor removed in 1988  1988    Parotid tumor removed   . Cholecystectomy    . C-secton  12/07/2004    Current Outpatient Prescriptions  Medication Sig Dispense Refill  . ALPRAZolam (XANAX) 0.25 MG tablet        No current facility-administered medications for this visit.    History   Social History  . Marital Status: Married    Spouse Name: N/A    Number of Children: N/A  . Years of Education: N/A  Occupational History  . Not on file.   Social History Main Topics  . Smoking status: Former Research scientist (life sciences)  . Smokeless tobacco: Not on file     Comment: " Smoked some in  college"    . Alcohol Use: Yes     Comment: " Occasionally 1-2 glasses a year"   . Drug Use: No  . Sexual Activity: Yes    Birth Control/ Protection: Condom   Other Topics Concern  . Not on file   Social History Narrative  . No narrative on file    Family History  Problem Relation Age of Onset  . Anemia Brother   . Cancer Paternal Aunt   . Cancer Maternal Grandmother   . Cancer Maternal Grandfather   . Cancer Paternal Grandfather       Alvino Chapel, MD 01/01/2014, 12:13 PM

## 2014-01-12 NOTE — Interval H&P Note (Signed)
History and Physical Interval Note:  01/12/2014 1:03 PM  Diamond Calhoun  has presented today for surgery, with the diagnosis of CERVICAL VILLOGLANDULAR ADENOCARCINOMA  The various methods of treatment have been discussed with the patient and family. After consideration of risks, benefits and other options for treatment, the patient has consented to  Procedure(s): ROBOTIC ASSISTED LAPAROSCOPIC RADICAL HYSECTOMEY BILATERAL SALPINGECTOMY AND PELVIC LYMPHADENECTOMY (N/A) as a surgical intervention .  The patient's history has been reviewed, patient examined, no change in status, stable for surgery.  I have reviewed the patient's chart and labs.  Questions were answered to the patient's satisfaction.     [place] A.

## 2014-01-12 NOTE — Transfer of Care (Signed)
Immediate Anesthesia Transfer of Care Note  Patient: Diamond Calhoun  Procedure(s) Performed: Procedure(s): ROBOTIC ASSISTED LAPAROSCOPIC RADICAL HYSECTOMEY BILATERAL SALPINGECTOMY AND PELVIC LYMPHADENECTOMY (N/A)  Patient Location: PACU  Anesthesia Type:General  Level of Consciousness: awake, alert , oriented and patient cooperative  Airway & Oxygen Therapy: Patient Spontanous Breathing and Patient connected to face mask oxygen  Post-op Assessment: Report given to PACU RN, Post -op Vital signs reviewed and stable and Patient moving all extremities  Post vital signs: Reviewed and stable  Complications: No apparent anesthesia complications

## 2014-01-12 NOTE — Op Note (Signed)
PATIENT: Diamond Calhoun DATE OF BIRTH: 12-07-1970 ENCOUNTER DATE: 01/12/14   Preop Diagnosis: IB adenocarcinoma of the cervix  Postoperative Diagnosis: same.   Surgery: Total robotic radical hysterectomy, bilateral salpingectomy, bilateral oophorectomy, bilateral pelvic lymph node dissection.   Surgeons:  Lucita Lora. Alycia Rossetti, MD; Lahoma Crocker, MD; Everitt Amber, MD  Anesthesia: General   Estimated blood loss: 100 ml   IVF: 1800 ml   Urine output: 355 ml   Complications: None   Pathology: Uterus, cervix, bilateral tubes, bilateral pelvic lymph nodes to pathology.   Operative findings: 1.5 cm cervical lesion, no adhesive disease,, no obvious lymphadenopathy.  Operative findings: At the time of exam under anesthesia. The cervix was 4 cm in size with a 1.5 cm lesion on the posterior lip of the cervix. The uterus was freely mobile though slightly globular. On rectovaginal examination the parametria were free. There were no pathologically enlarged lymph nodes.   Normal appearing ovaries and fallopian tubes  Procedure: The patient was identified in the preoperative holding area. Informed consent was signed on the chart. Patient was seen history was reviewed and exam was performed. Urine pregnancy test was negative.  The patient was then taken to the operating room and placed in the supine position with SCD hose on. She was then placed in the dorsolithotomy position. Her arms were tucked at her side with appropriate precautions on the gel pad. General anesthesia was then induced without difficulty. Shoulder supports were then placed in the usual fashion with appropriate precautions. A OG-tube was placed to suction. First timeout was performed to confirm the patient, procedure, antibiotic, allergy status, estimated blood loss and OR time. The perineum was then prepped in the usual fashion with Betadine. A 14 French Foley was inserted into the bladder under sterile conditions. The ZUMI with a  large Koh ring and a vaginal length marker was inserted into the vagina without difficulty. The abdomen was then prepped with 1 Chlor prep sponge per protocol.   Patient was then draped after the prep was dried. Second timeout was performed to confirm the above. After again confirming OG tube placement and it was to suction. A stab-wound was made in left upper quadrant 2 cm below the costal margin on the left in the midclavicular line. A 5 mm operative report was used to assure intra-abdominal placement. The abdomen was insufflated. At this point all points during the procedure the patient's intra-abdominal pressure was not increased over 15 mm of mercury. After insufflation was complete, the patient was placed in deep Trendelenburg position. 20 cm above the pubic symphysis that area was marked the camera port, this corresponded to the umbilicus. Bilateral robotic ports were marked 10 cm from the midline incision at approximately 5 angle. The fourth arm was marked 2 cm superior and medial to the ACIS. Under direct visualization each of the trochars was placed into the abdomen. The small bowel was folded on its mesentery to allow visualization to the pelvis. The 5 mm LUQ port was then converted to a 10/12 port under direct visualization. After assuring adequate visualization, the robot was then docked in the usual fashion. Under direct visualization the robotic instruments replaced.   The round ligament on the patient's right side was transected with monopolar cautery. The posterior leaf of the broad ligament was then taken down in the usual fashion. The paravesical and pararectal spaces were opened. The uterine artery was skeletonized and cauterized with bipolar cautery. It was then transected with monopolar cautery. The ureter was  identified on the medial leaf of the broad ligament. It was freed from its medial attachments to the peritoneum. In order to free the peritoneum, our attention was then drawn to the  fallopian tube on the right. Using bipolar cautery a salpingectomy was performed. A window was then made between the IP and the ureter. The utero-ovarian artery was coagulated with bipolar cautery and transected. This allowed the ovary to be elevated into the superior aspect of the abdomen. The posterior leaf of the broad ligament was taken down to the level of the uterosacral ligaments.   Using the [facility] and the vaginal length marker, we were able to delineate the posterior margin. The space between the vagina and the rectum was opened. There was some fibrosis in this area making it slightly more difficult. However, we were able to skeletonize the uterosacral ligaments. We proceeded laterally and the uterine artery and vein were brought over the ureter. We continued our dissection the level of the bladder. Anteriorly the bladder flap was created. There was minimal fibrosis and adhesive disease in this area. Again, using the vaginal length marker,  we were able to delineate the anterior vaginal margin. We continued skeletonizing the ureter anteriorly. The lateral bladder pillars were taken down, identified and cauterized with bipolar cautery. There were then transected. This allowed [place] to completely mobilized the bladder inferiorly and the ureter was taken inferiorly and laterally. Once we were pleased with the anterior vaginal margin, the same procedure was performed on the patient's left side.   Once the left side was completed we were able to mobilize the bladder anteriorly to achieve a 2 cm margin. On the posterior aspect again there was some fibrosis however we were able to dissect the rectum off of the vagina. The uterosacrals were coagulated with bipolar cautery and transected. The small amount of bleeding from the left uterosacral. This was controlled with bipolar cautery. The ureter was well lateral of this area of dissection. The pneumo occluded balloon was then insufflated. Using monopolar cautery the  colpotomy was performed. The specimen was delivered to the vagina without difficulty. I inspected the specimen and there was an adequate circumferential margin. The pneumo occluded balloon was replaced into the vagina.   The obturator nerve was identified on the right side. The nodal bundle extending over the common iliac artery down to the external iliac artery and the circumflex iliac vein was taken down using sharp dissection and monopolar cautery. The genitofemoral nerve was identified and spared. We continued our dissection down the external iliac vein to the level of the obturator nerve. The nodal bundle superior to the obturator nerve was taken. All pedicles were noted to be hemostatic. The ureter was noted to be well medial of the area of dissection. The nodal bundle was then placed and an Endo catch bag. The same procedure was performed on the left side. The nodal bundles were placed in Endo catch bag and delivered through the vagina.   The vaginal cuff was closed with a running 0 Vicryl on CT 1 suture starting from each angle and tied individually in the middle of the vaginal cuff. Our attention was then directed towards performing the oophoropexy. Using a suture of 0 Vicryl on CT 1 the ovary was tacked to the lateral peritoneum as high as possible. We ensured there is no twisting of the vascular supply and that there was not too much tension on the blood supply. A similar procedure was performed on the left.  The abdomen and pelvis were copiously irrigated and noted to be hemostatic. The robotic instruments were removed under direct visualization as were the robotic trochars. The pneumoperitoneum was removed. The patient was then taken out of the Trendelenburg position. Using of 0 Vicryl on a UR 6 needle the midline port fascia was closed after being grasped with allis clamps. The subcutaneous tissues of the port in the left upper quadrant was reapproximated. The skin was closed using 4-0 Vicryl.  Steri-Strips and benzoin were applied. The pneumo occluded balloon was removed from the vagina. The vagina was swabbed and noted to be hemostatic.    All instrument needle and Ray-Tec counts were correct x2. The patient tolerated the procedure well and was taken to the recovery room in stable condition. This is [facility] dictating an operative note on patient [place].

## 2014-01-13 ENCOUNTER — Encounter (HOSPITAL_COMMUNITY): Payer: Self-pay | Admitting: Gynecologic Oncology

## 2014-01-13 LAB — CBC
HCT: 31.3 % — ABNORMAL LOW (ref 36.0–46.0)
HEMOGLOBIN: 11 g/dL — AB (ref 12.0–15.0)
MCH: 33.3 pg (ref 26.0–34.0)
MCHC: 35.1 g/dL (ref 30.0–36.0)
MCV: 94.8 fL (ref 78.0–100.0)
PLATELETS: 148 10*3/uL — AB (ref 150–400)
RBC: 3.3 MIL/uL — AB (ref 3.87–5.11)
RDW: 12.3 % (ref 11.5–15.5)
WBC: 12.6 10*3/uL — ABNORMAL HIGH (ref 4.0–10.5)

## 2014-01-13 LAB — BASIC METABOLIC PANEL
BUN: 7 mg/dL (ref 6–23)
CO2: 24 mEq/L (ref 19–32)
Calcium: 9 mg/dL (ref 8.4–10.5)
Chloride: 101 mEq/L (ref 96–112)
Creatinine, Ser: 0.53 mg/dL (ref 0.50–1.10)
GFR calc Af Amer: >90 mL/min (ref >90)
GFR calc non Af Amer: >90 mL/min (ref >90)
Glucose, Bld: 147 mg/dL — ABNORMAL HIGH (ref 70–99)
POTASSIUM: 4.1 meq/L (ref 3.7–5.3)
SODIUM: 137 meq/L (ref 137–147)

## 2014-01-13 MED ORDER — IBUPROFEN 600 MG PO TABS
600.0000 mg | ORAL_TABLET | Freq: Four times a day (QID) | ORAL | Status: DC | PRN
  Administered 2014-01-13 – 2014-01-14 (×2): 600 mg via ORAL
  Filled 2014-01-13 (×2): qty 1

## 2014-01-13 MED ORDER — ENOXAPARIN SODIUM 40 MG/0.4ML ~~LOC~~ SOLN
40.0000 mg | SUBCUTANEOUS | Status: DC
Start: 2014-01-13 — End: 2014-01-14
  Administered 2014-01-13 – 2014-01-14 (×2): 40 mg via SUBCUTANEOUS
  Filled 2014-01-13 (×2): qty 0.4

## 2014-01-13 NOTE — Progress Notes (Signed)
Patient ID: CORAL TIMME, female   DOB: [birth year], 43 y.o.   MRN: [id number] 1 Day Post-Op Procedure(s) (LRB): ROBOTIC ASSISTED LAPAROSCOPIC RADICAL HYSECTOMEY BILATERAL SALPINGECTOMY AND PELVIC LYMPHADENECTOMY (N/A)  Subjective: Patient reports nausea.    Objective: Vital signs in last 24 hours: Temp:  [97.3 F (36.3 C)-98.5 F (36.9 C)] 98.5 F (36.9 C) (06/24 0527) Pulse Rate:  [50-102] 60 (06/24 0527) Resp:  [8-20] 16 (06/24 0527) BP: (97-135)/(52-83) 112/72 mmHg (06/24 0527) SpO2:  [99 %-100 %] 100 % (06/24 0527) Weight:  [64.411 kg (142 lb)] 64.411 kg (142 lb) (06/23 2300)    Intake/Output from previous day: 06/23 0701 - 06/24 0700 In: 4302.5 [P.O.:720; I.V.:3582.5] Out: 1330 [Urine:1230; Blood:100]  Physical Examination: General: alert Resp: clear to auscultation bilaterally Cardio: regular rate and rhythm, S1, S2 normal, no murmur, click, rub or gallop GI: soft, non-tender; bowel sounds normal; no masses,  no organomegaly Extremities: extremities normal, atraumatic, no cyanosis or edema and Homans sign is negative, no sign of DVT Vaginal Bleeding: none  Labs: WBC/Hgb/Hct/Plts:  12.6/11.0/31.3/148 (06/24 0350) BUN/Cr/glu/ALT/AST/amyl/lip:  7/0.53/--/--/--/--/-- (06/24 0938)   Assessment:  43 y.o. s/p Procedure(s): ROBOTIC ASSISTED LAPAROSCOPIC RADICAL HYSECTOMEY BILATERAL SALPINGECTOMY AND PELVIC LYMPHADENECTOMY: stable Pain:  Pain is well-controlled on or oral medications.  GI:  Tolerating liquids.  Likely residual anesthetic effect  FEN: Electrolytes in range  Prophylaxis: intermittent pneumatic compression boots.  Lovenox while inpatient.  Plan: Encourage ambulation Diet as tolerated Check CBC    LOS: 1 day    JACKSON-MOORE,LISA A 01/13/2014, 9:57 AM

## 2014-01-13 NOTE — Care Management Note (Signed)
    Page 1 of 1   01/13/2014     10:48:28 AM CARE MANAGEMENT NOTE 01/13/2014  Patient:  Diamond Calhoun, Diamond Calhoun   Account Number:  [id number]  Date Initiated:  01/13/2014  Documentation initiated by:  Sunday Spillers  Subjective/Objective Assessment:   43 yo female admitted s/p robotic hysterectomy. PTA live at hom with spouse.     Action/Plan:   Home when stable   Anticipated DC Date:  01/15/2014   Anticipated DC Plan:  [place]  CM consult      Choice offered to / List presented to:             Status of service:  Completed, signed off Medicare Important Message given?  NO (If response is "NO", the following Medicare IM given date fields will be blank) Date Medicare IM given:   Date Additional Medicare IM given:    Discharge Disposition:  HOME/SELF CARE  Per UR Regulation:  Reviewed for med. necessity/level of care/duration of stay  If discussed at [place] of Stay Meetings, dates discussed:    Comments:

## 2014-01-14 MED ORDER — ONDANSETRON HCL 4 MG PO TABS: 4.0000 mg | ORAL_TABLET | Freq: Four times a day (QID) | ORAL | Status: DC | PRN

## 2014-01-14 MED ORDER — OXYCODONE-ACETAMINOPHEN 5-325 MG PO TABS: 1.0000 | ORAL_TABLET | ORAL | Status: DC | PRN

## 2014-01-14 MED ORDER — ENOXAPARIN SODIUM 40 MG/0.4ML ~~LOC~~ SOLN: 40.0000 mg | SUBCUTANEOUS | Status: DC

## 2014-01-14 NOTE — Discharge Summary (Signed)
Physician Discharge Summary  Patient ID: EKATERINI CAPITANO MRN: [id number] DOB/AGE: 04-12-1971 43 y.o.  Admit date: 01/12/2014 Discharge date: 01/14/2014  Admission Diagnoses: cervical cancer  Discharge Diagnoses:  Active Problems:   Cervical cancer   Discharged Condition: good  Hospital Course: Louna was admitted to the hospital on 01/12/14 for a robotic assisted radical hysterectomy, bilateral salpingectomy and bilateral lymphadenectomy for a stage IB adenocarcinoma of the cervix. Her surgery was uneventful and uncomplicated. She experienced postoperative nausea on POD 1 which limited PO intake and this delayed discharge until its resolution on POD 2. Her foley was continued postop for 1 week due to the radical parametrial resection and concern for urinary retention.  Consults: None  Significant Diagnostic Studies: labs: Hb appropriate postop  Treatments: IV hydration and surgery: see above  Discharge Exam: Blood pressure 100/65, pulse 82, temperature 98.3 F (36.8 C), temperature source Oral, resp. rate 16, height 5\' 7"  (1.702 m), weight 142 lb (64.411 kg), last menstrual period 01/02/2014, SpO2 98.00%. General appearance: alert Eyes: conjunctivae/corneas clear. PERRL, EOM's intact. Fundi benign. Neck: no adenopathy, no carotid bruit, no JVD, supple, symmetrical, trachea midline and thyroid not enlarged, symmetric, no tenderness/mass/nodules Back: symmetric, no curvature. ROM normal. No CVA tenderness. Resp: clear to auscultation bilaterally Chest wall: no tenderness Cardio: regular rate and rhythm, S1, S2 normal, no murmur, click, rub or gallop GI: soft, non-tender; bowel sounds normal; no masses,  no organomegaly Pelvic: vagina normal without discharge Extremities: extremities normal, atraumatic, no cyanosis or edema Incision/Wound:  Disposition: 01-Home or Self Care  Discharge Instructions   (HEART FAILURE PATIENTS) Call MD:  Anytime you have any of the following  symptoms: 1) 3 pound weight gain in 24 hours or 5 pounds in 1 week 2) shortness of breath, with or without a dry hacking cough 3) swelling in the hands, feet or stomach 4) if you have to sleep on extra pillows at night in order to breathe.    Complete by:  As directed      Call MD for:  difficulty breathing, headache or visual disturbances    Complete by:  As directed      Call MD for:  extreme fatigue    Complete by:  As directed      Call MD for:  hives    Complete by:  As directed      Call MD for:  persistant dizziness or light-headedness    Complete by:  As directed      Call MD for:  persistant nausea and vomiting    Complete by:  As directed      Call MD for:  redness, tenderness, or signs of infection (pain, swelling, redness, odor or green/yellow discharge around incision site)    Complete by:  As directed      Call MD for:  severe uncontrolled pain    Complete by:  As directed      Call MD for:  temperature >100.4    Complete by:  As directed      Diet - low sodium heart healthy    Complete by:  As directed      Diet general    Complete by:  As directed      Discontinue IV    Complete by:  As directed      Driving Restrictions    Complete by:  As directed   No driving for 7 days or until off narcotic pain medication     Increase activity slowly  Complete by:  As directed      Remove dressing in 24 hours    Complete by:  As directed      Sexual Activity Restrictions    Complete by:  As directed   No intercourse for 6 weeks            Medication List         ALPRAZolam 0.25 MG tablet  Commonly known as:  XANAX  Take 0.125 mg by mouth 2 (two) times daily as needed for anxiety.     enoxaparin 40 MG/0.4ML injection  Commonly known as:  LOVENOX  Inject 0.4 mLs (40 mg total) into the skin daily.     ibuprofen 200 MG tablet  Commonly known as:  ADVIL,MOTRIN  Take 400 mg by mouth every 6 (six) hours as needed (Pain).     ondansetron 4 MG tablet  Commonly known  as:  ZOFRAN  Take 1 tablet (4 mg total) by mouth every 6 (six) hours as needed for nausea.     oxyCODONE-acetaminophen 5-325 MG per tablet  Commonly known as:  PERCOCET/ROXICET  Take 1-2 tablets by mouth every 4 (four) hours as needed for severe pain (moderate to severe pain (when tolerating fluids)).     tetrahydrozoline 0.05 % ophthalmic solution  Place 1 drop into both eyes as needed (allergies).           Follow-up Information   Follow up with Zipporah Plants, RN On 01/19/2014. (For catheter removal)    Contact information:   - - - - [place] [phone]       Signed: Donaciano Eva 01/14/2014, 10:55 AM

## 2014-01-14 NOTE — Progress Notes (Signed)
Patient ID: Diamond Calhoun, female   DOB: 1971-01-07, 43 y.o.   MRN: [id number] 2 Days Post-Op Procedure(s) (LRB): ROBOTIC ASSISTED LAPAROSCOPIC RADICAL HYSECTOMEY BILATERAL SALPINGECTOMY AND PELVIC LYMPHADENECTOMY (N/A)  Subjective: Patient reports improved nausea. She does have some shoulder tip pain (right) which is mild and some adominal bloating. + flatus.  Pain well controlled with motrin.  Objective: Vital signs in last 24 hours: Temp:  [98.3 F (36.8 C)-99.4 F (37.4 C)] 98.3 F (36.8 C) (06/25 0557) Pulse Rate:  [71-82] 82 (06/25 0557) Resp:  [16-18] 16 (06/25 0557) BP: (91-106)/(56-73) 100/65 mmHg (06/25 0557) SpO2:  [98 %-100 %] 98 % (06/25 0557)    Intake/Output from previous day: 06/24 0701 - 06/25 0700 In: 2880 [P.O.:1080; I.V.:1800] Out: 3150 [Urine:3150]  Physical Examination: General: alert Resp: clear to auscultation bilaterally Cardio: regular rate and rhythm, S1, S2 normal, no murmur, click, rub or gallop GI: soft, non-tender; bowel sounds normal; no masses,  no organomegaly Extremities: extremities normal, atraumatic, no cyanosis or edema and Homans sign is negative, no sign of DVT Vaginal Bleeding: none  Labs:     Hb was appropriate yesterday on POD 1.   Assessment:  43 y.o. s/p Procedure(s): ROBOTIC ASSISTED LAPAROSCOPIC RADICAL HYSECTOMEY BILATERAL SALPINGECTOMY AND PELVIC LYMPHADENECTOMY: stable Pain:  Pain is well-controlled on or oral medications.  GI:  Tolerating PO, nausea resolved  FEN: Electrolytes in range  GU: Foley in situ, and patient will be discharged with foley due to radical parametrial resection and risk for urinary retention. Will plan for foley removal in office 1 week postop.  Prophylaxis: intermittent pneumatic compression boots.  Lovenox while inpatient.  Plan: Encourage ambulation Discharge today to home with plan for followup in office for foley removal in 1 week. Donaciano Eva, MD  Check CBC    LOS: 2  days    Donaciano Eva 01/14/2014, 10:30 AM

## 2014-01-14 NOTE — Discharge Summary (Signed)
01/14/2014  Return to work: 4 weeks  Activity: 1. Be up and out of the bed during the day.  Take a nap if needed.  You may walk up steps but be careful and use the hand rail.  Stair climbing will tire you more than you think, you may need to stop part way and rest.   2. No lifting or straining for 6 weeks.  3. No driving for 1-2 weeks.  Do Not drive if you are taking narcotic pain medicine.  4. Shower daily.  Use soap and water on your incision and pat dry; don't rub.   5. No sexual activity and nothing in the vagina for 6 weeks.  Diet: 1. Low sodium Heart Healthy Diet is recommended.  2. It is safe to use a laxative if you have difficulty moving your bowels.   Wound Care: 1. Keep clean and dry.  Shower daily.  Reasons to call the Doctor:   Fever - Oral temperature greater than 100.4 degrees Fahrenheit  Foul-smelling vaginal discharge  Difficulty urinating or pain and discomfort in bladder  Nausea and vomiting  Increased pain at the site of the incision that is unrelieved with pain medicine.  Difficulty breathing with or without chest pain  New calf pain especially if only on one side  Sudden, continuing increased vaginal bleeding with or without clots.   Follow-up: 1. See Armanda Magic on 01/19/14 for foley catheter removal   2. See Dr. Alycia Rossetti or Dr Denman George in approximately 1 month.  Contacts: For questions or concerns you should contact:  Dr. Denman George at [phone]

## 2014-01-14 NOTE — Progress Notes (Signed)
Nurse reviewed discharge instructions with pt and husband.  Pt verbalized understanding of new medications, discharge instructions and follow up appointment for catheter removal.  Pt successfully self administered a dose of lovenox to lower left abdomen before discharge as she was given prescription for Lovenox.  No concerns at time of discharge.

## 2014-01-14 NOTE — Anesthesia Postprocedure Evaluation (Signed)
Anesthesia Post Note  Patient: Diamond Calhoun  Procedure(s) Performed: Procedure(s) (LRB): ROBOTIC ASSISTED LAPAROSCOPIC RADICAL HYSECTOMEY BILATERAL SALPINGECTOMY AND PELVIC LYMPHADENECTOMY (N/A)  Anesthesia type: General  Patient location: PACU  Post pain: Pain level controlled  Post assessment: Post-op Vital signs reviewed  Last Vitals:  Filed Vitals:   01/14/14 0557  BP: 100/65  Pulse: 82  Temp: 36.8 C  Resp: 16    Post vital signs: Reviewed  Level of consciousness: sedated  Complications: No apparent anesthesia complications

## 2014-01-18 ENCOUNTER — Encounter: Payer: Self-pay | Admitting: Gynecology

## 2014-01-18 ENCOUNTER — Ambulatory Visit: Payer: Federal, State, Local not specified - PPO

## 2014-01-18 ENCOUNTER — Ambulatory Visit: Payer: Federal, State, Local not specified - PPO | Attending: Gynecology | Admitting: Gynecology

## 2014-01-18 ENCOUNTER — Telehealth: Payer: Self-pay | Admitting: *Deleted

## 2014-01-18 ENCOUNTER — Other Ambulatory Visit: Payer: Self-pay | Admitting: *Deleted

## 2014-01-18 VITALS — BP 129/88 | HR 97 | Temp 99.7°F | Resp 20 | Ht 67.0 in

## 2014-01-18 DIAGNOSIS — Z90710 Acquired absence of both cervix and uterus: Secondary | ICD-10-CM | POA: Insufficient documentation

## 2014-01-18 DIAGNOSIS — E8779 Other fluid overload: Secondary | ICD-10-CM

## 2014-01-18 DIAGNOSIS — N390 Urinary tract infection, site not specified: Secondary | ICD-10-CM

## 2014-01-18 DIAGNOSIS — C539 Malignant neoplasm of cervix uteri, unspecified: Secondary | ICD-10-CM

## 2014-01-18 DIAGNOSIS — Z79899 Other long term (current) drug therapy: Secondary | ICD-10-CM | POA: Insufficient documentation

## 2014-01-18 LAB — URINALYSIS, MICROSCOPIC - CHCC
Bilirubin (Urine): NEGATIVE
Glucose: NEGATIVE mg/dL
Ketones: NEGATIVE mg/dL
NITRITE: NEGATIVE
PH: 8 (ref 4.6–8.0)
Protein: <30 mg/dL
Specific Gravity, Urine: 1.005 (ref 1.003–1.035)
UROBILINOGEN UR: 0.2 mg/dL (ref 0.2–1)

## 2014-01-18 NOTE — Patient Instructions (Signed)
Begin Septra DS 1 tablet twice a day.

## 2014-01-18 NOTE — Telephone Encounter (Signed)
Returned pt's call, pt states " it's burning like when I have had a bladder infection int he past, the urine has a foul odor & it's cloudy. Informed pt to come in to give urine specimen as she may have a bladder infection. Appt at 1:15pm, pt complains of area black in color on Sunday, feeling of fluid draining from abdominal area to vagina area, with tightness in legs/below hips. Reviewed with Dr. Wayland Salinas who advised pt to come in for UA/Culture and will evaluate

## 2014-01-18 NOTE — Progress Notes (Signed)
Consult Note: Gyn-Onc   Diamond Calhoun 43 y.o. female  No chief complaint on file.   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 Postoperative fluid retention. Flank and vulvar hematoma (suggestive of a retroperitoneal bleed). Probable urinary tract infection.  Plan:  . Patient's pathology report is reviewed and is favorable. No further therapy is necessary.  Patient has a hematoma on the vulva and left flank suggestive of retroperitoneal bleed. The patient is reassured that this will reabsorb.  She also has bilateral thigh fluid retention most likely secondary to intravenous fluids during surgery. I expect this to resolve spontaneously.. Symptoms suggestive of a urinary tract infection. Urine cultures sent today. We will empirically start the patient on Septra DS for 5 days.  HPI: 43 year old white female seen in consultation request of Dr. Everlene Farrier regarding management of a newly diagnosed adenocarcinoma of cervix (villoglandular). The patient presented with profuse clear vaginal discharge. Initial Pap smear showed atypical glandular cells. Colposcopy with directed biopsies revealed a lesion at the squamocolumnar junction (circumferential). Biopsies revealed adenocarcinoma villoglandular type.  Patient reports she has regular cyclic menses although the duration has become shortened and the frequency increased in recent years. She is not using any oral contraceptives or any other hormones.  Obstetrical history gravida 1 (cesarean section). She's had no other gynecologic surgery. 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 Final pathology showed negative margins and negative nodes  Review of Systems:10 point review of systems is negative except as noted in interval history.   Vitals: Blood  pressure 129/88, pulse 97, temperature 99.7 F (37.6 C), temperature source Oral, resp. rate 20, height 5\' 7"  (1.702 m), last menstrual period 01/03/2014.  Physical Exam: General : The patient is a healthy woman in no acute distress.  HEENT: normocephalic, extraoccular movements normal; neck is supple without thyromegally  Lynphnodes: Supraclavicular and inguinal nodes not enlarged  Abdomen: Soft, non-tender, no ascites, no organomegally, no masses, no hernias , there is a ecchymotic area originating near the left iliac crest and extending into the flank. All of the surgical wound sites are healing well. Pelvic:  EGBUS: The vulva is ecchymotic with more edema on the left.  There is no CVA tenderness.       No Known Allergies  Past Medical History  Diagnosis Date  . Anxiety   . Cancer     cervical ca dx'd 2015  . Headache(784.0)     MIGRAINES  . Tinnitus   . Abdominal pain, recurrent     RUQ  AROUND TO BACK    Past Surgical History  Procedure Laterality Date  . Benign tumor removed in 1988  1988    Parotid tumor removed   . Cholecystectomy    . C-secton  12/07/2004  . Robotic assisted total hysterectomy with bilateral salpingo oopherectomy N/A 01/12/2014    Procedure: ROBOTIC ASSISTED LAPAROSCOPIC RADICAL HYSECTOMEY BILATERAL SALPINGECTOMY AND PELVIC LYMPHADENECTOMY;  Surgeon: Imagene Gurney A. Alycia Rossetti, MD;  Location: [facility];  Service: Gynecology;  Laterality: N/A;    Current Outpatient Prescriptions  Medication Sig Dispense Refill  . ALPRAZolam (XANAX) 0.25 MG tablet Take 0.125 mg by mouth 2 (two) times daily as needed for anxiety.       . enoxaparin (LOVENOX) 40 MG/0.4ML injection Inject 0.4 mLs (40 mg total) into the skin daily.  12 Syringe  0  .  ibuprofen (ADVIL,MOTRIN) 200 MG tablet Take 400 mg by mouth every 6 (six) hours as needed (Pain).      . ondansetron (ZOFRAN) 4 MG tablet Take 1 tablet (4 mg total) by mouth every 6 (six) hours as needed for nausea.  20 tablet  0  .  tetrahydrozoline 0.05 % ophthalmic solution Place 1 drop into both eyes as needed (allergies).      Marland Kitchen oxyCODONE-acetaminophen (PERCOCET/ROXICET) 5-325 MG per tablet Take 1-2 tablets by mouth every 4 (four) hours as needed for severe pain (moderate to severe pain (when tolerating fluids)).  30 tablet  0   No current facility-administered medications for this visit.    History   Social History  . Marital Status: Married    Spouse Name: N/A    Number of Children: N/A  . Years of Education: N/A   Occupational History  . Not on file.   Social History Main Topics  . Smoking status: Never Smoker   . Smokeless tobacco: Not on file     Comment: " Smoked some in college"    . Alcohol Use: No  . Drug Use: No  . Sexual Activity: Yes    Birth Control/ Protection: Condom   Other Topics Concern  . Not on file   Social History Narrative  . No narrative on file    Family History  Problem Relation Age of Onset  . Anemia Brother   . Cancer Paternal Aunt   . Cancer Maternal Grandmother   . Cancer Maternal Grandfather   . Cancer Paternal Grandfather       Alvino Chapel, MD 01/18/2014, 2:13 PM

## 2014-01-20 ENCOUNTER — Telehealth: Payer: Self-pay | Admitting: *Deleted

## 2014-01-20 LAB — URINE CULTURE

## 2014-01-20 NOTE — Telephone Encounter (Signed)
Pt here for bladder training, filled bladder with 200cc sterile water, pt attempted to void, urine output 25cc. Inserted 16 french foley into bladder, straw colored urine drained to gravity measured at 300cc. Pt instructed to return on Monday July 6 for bladder training. Pt verbalized understanding. No further concerns.

## 2014-01-21 ENCOUNTER — Telehealth (INDEPENDENT_AMBULATORY_CARE_PROVIDER_SITE_OTHER): Payer: Self-pay | Admitting: Gynecologic Oncology

## 2014-01-21 NOTE — Telephone Encounter (Signed)
Feeling much better today. Her bruising is improving as is her pain. She was able to sleep. Will come in on Monday for repeat voiding trial.

## 2014-01-22 ENCOUNTER — Emergency Department (HOSPITAL_COMMUNITY)
Admission: EM | Admit: 2014-01-22 | Discharge: 2014-01-22 | Disposition: A | Discharge status: Home / Self Care | Payer: Federal, State, Local not specified - PPO | Attending: Emergency Medicine | Admitting: Emergency Medicine

## 2014-01-22 ENCOUNTER — Encounter (HOSPITAL_COMMUNITY): Payer: Self-pay | Admitting: Emergency Medicine

## 2014-01-22 DIAGNOSIS — N3289 Other specified disorders of bladder: Secondary | ICD-10-CM | POA: Insufficient documentation

## 2014-01-22 DIAGNOSIS — Z8669 Personal history of other diseases of the nervous system and sense organs: Secondary | ICD-10-CM | POA: Insufficient documentation

## 2014-01-22 DIAGNOSIS — Z792 Long term (current) use of antibiotics: Secondary | ICD-10-CM | POA: Insufficient documentation

## 2014-01-22 DIAGNOSIS — Z79899 Other long term (current) drug therapy: Secondary | ICD-10-CM | POA: Insufficient documentation

## 2014-01-22 DIAGNOSIS — Z8541 Personal history of malignant neoplasm of cervix uteri: Secondary | ICD-10-CM | POA: Insufficient documentation

## 2014-01-22 DIAGNOSIS — F411 Generalized anxiety disorder: Secondary | ICD-10-CM | POA: Insufficient documentation

## 2014-01-22 NOTE — ED Notes (Signed)
Pt urinated 220 ml, light red, cloudy urine. Dr Eulis Foster notified

## 2014-01-22 NOTE — ED Provider Notes (Signed)
CSN: [id number]     Arrival date & time 01/22/14  5366 History   First MD Initiated Contact with Patient 01/22/14 1007     No chief complaint on file.    (Consider location/radiation/quality/duration/timing/severity/associated sxs/prior Treatment) HPI  Diamond Calhoun is a 43 y.o. female who complains of leaking urine around her Foley catheter. She denies pain, fever, chills, nausea, vomiting, or vaginal bleeding. She had a radical hysterectomy done two weeks ago. A Foley was placed after the hysterectomy. She had a trial of Foley removal 2 days ago and failed. She has not had chronic Foley catheterizations. There are no known modifying factors.   Past Medical History  Diagnosis Date  . Anxiety   . Cancer     cervical ca dx'd 2015  . Headache(784.0)     MIGRAINES  . Tinnitus   . Abdominal pain, recurrent     RUQ  AROUND TO BACK   Past Surgical History  Procedure Laterality Date  . Benign tumor removed in 1988  1988    Parotid tumor removed   . Cholecystectomy    . C-secton  12/07/2004  . Robotic assisted total hysterectomy with bilateral salpingo oopherectomy N/A 01/12/2014    Procedure: ROBOTIC ASSISTED LAPAROSCOPIC RADICAL HYSECTOMEY BILATERAL SALPINGECTOMY AND PELVIC LYMPHADENECTOMY;  Surgeon: Diamond Gurney A. Alycia Rossetti, MD;  Location: [facility];  Service: Gynecology;  Laterality: N/A;   Family History  Problem Relation Age of Onset  . Anemia Brother   . Cancer Paternal Aunt   . Cancer Maternal Grandmother   . Cancer Maternal Grandfather   . Cancer Paternal Grandfather    History  Substance Use Topics  . Smoking status: Never Smoker   . Smokeless tobacco: Not on file     Comment: " Smoked some in college"    . Alcohol Use: No   OB History   Grav Para Term Preterm Abortions TAB SAB Ect Mult Living                 Review of Systems  All other systems reviewed and are negative.     Allergies  Review of patient's allergies indicates no known allergies.  Home Medications    Prior to Admission medications   Medication Sig Start Date End Date Taking? Authorizing Provider  ALPRAZolam (XANAX) 0.25 MG tablet Take 0.125 mg by mouth 2 (two) times daily as needed for anxiety.  12/24/13  Yes Historical Provider, MD  enoxaparin (LOVENOX) 40 MG/0.4ML injection Inject 40 mg into the skin daily.   Yes Historical Provider, MD  ibuprofen (ADVIL,MOTRIN) 200 MG tablet Take 400 mg by mouth every 6 (six) hours as needed (Pain).   Yes Historical Provider, MD  polyethylene glycol (MIRALAX / GLYCOLAX) packet Take 17 g by mouth daily.   Yes Historical Provider, MD  sulfamethoxazole-trimethoprim (BACTRIM DS) 800-160 MG per tablet Take 1 tablet by mouth 2 (two) times daily. 01/18/14 01/23/14 Yes Historical Provider, MD   BP 127/75  Pulse 106  Temp(Src) 98.8 F (37.1 C)  Resp 20  SpO2 97%  LMP 01/03/2014 Physical Exam  Nursing note and vitals reviewed. Constitutional: She is oriented to person, place, and time. She appears well-developed and well-nourished.  HENT:  Head: Normocephalic and atraumatic.  Eyes: Conjunctivae and EOM are normal. Pupils are equal, round, and reactive to light.  Neck: Normal range of motion and phonation normal. Neck supple.  Cardiovascular: Normal rate and regular rhythm.   Pulmonary/Chest: Effort normal.  Musculoskeletal: Normal range of motion.  Neurological: She  is alert and oriented to person, place, and time. She exhibits normal muscle tone.  Skin: Skin is warm and dry.  Psychiatric: She has a normal mood and affect. Her behavior is normal. Judgment and thought content normal.    ED Course  Procedures (including critical care time)  Medications - No data to display  Patient Vitals for the past 24 hrs:  BP Temp Pulse Resp SpO2  01/22/14 0957 127/75 mmHg 98.8 F (37.1 C) 106 20 97 %   Bladder scan revealed 42 cc, on arrival.  I discussed the case with urology, who had nonspecific recommendations for care. He felt that the catheter could be  removed and she could undergo a voiding trial at this time.  Voiding trial- saline infusion into the bladder via the Foley catheter, and the catheter was removed.   1:21 PM Reevaluation with update and discussion. After initial assessment and treatment, an updated evaluation reveals . She was able to void 200 cc post installation of saline, into the bladder. She feels comfortable at this time. Diamond Calhoun     Labs Review Labs Reviewed  URINE CULTURE    Imaging Review No results found.   EKG Interpretation None      MDM   Final diagnoses:  Bladder spasm    Evaluation consisted of bladder spasm, doubt UTI, patient is currently on antibiotic medication. She performed well on voiding trial , In the emergency department.  Nursing Notes Reviewed/ Care Coordinated Applicable Imaging Reviewed Interpretation of Laboratory Data incorporated into ED treatment  The patient appears reasonably screened and/or stabilized for discharge and I doubt any other medical condition or other [facility] [employer] requiring further screening, evaluation, or treatment in the ED at this time prior to discharge.  Plan: Home Medications- usual; Home Treatments- rest, plenty of fluids; return here if the recommended treatment, does not improve the symptoms; Recommended follow up- Gyn Onc. As scheduled      Richarda Blade, MD 01/22/14 1328

## 2014-01-22 NOTE — ED Notes (Signed)
Pt has a foley catheter since 6/23 due to a radical hysterectomy. Pt noted to have leaking around the area of insertion. No increase in pain. Pt was to have a follow up this coming Monday. Pt currently has the foley placed with a bag attached.

## 2014-01-22 NOTE — Discharge Instructions (Signed)
Drink plenty of fluid. Return here if you are unable to void. We sent a urine culture that could possibly show a infection that could require changing your antibiotic. See your oncologic gynecologist, as scheduled.     Overactive Bladder, Adult The bladder has two functions that are totally opposite of the other. One is to relax and stretch out so it can store urine (fills like a balloon), and the other is to contract and squeeze down so that it can empty the urine that it has stored. Proper functioning of the bladder is a complex mixing of these two functions. The filling and emptying of the bladder can be influenced by:  The bladder.  The spinal cord.  The brain.  The nerves going to the bladder.  Other organs that are closely related to the bladder such as prostate in males and the vagina in females. As your bladder fills with urine, nerve signals are sent from the bladder to the brain to tell you that you may need to urinate. Normal urination requires that the bladder squeeze down with sufficient strength to empty the bladder, but this also requires that the bladder squeeze down sufficiently long to finish the job. In addition the sphincter muscles, which normally keep you from leaking urine, must also relax so that the urine can pass. Coordination between the bladder muscle squeezing down and the sphincter muscles relaxing is required to make everything happen normally. With an overactive bladder sometimes the muscles of the bladder contract unexpectedly and involuntarily and this causes an urgent need to urinate. The normal response is to try to hold urine in by contracting the sphincter muscles. Sometimes the bladder contracts so strongly that the sphincter muscles cannot stop the urine from passing out and incontinence occurs. This kind of incontinence is called urge incontinence. Having an overactive bladder can be embarrassing and awkward. It can keep you from living life the way  you want to. Many people think it is just something you have to put up with as you grow older or have certain health conditions. In fact, there are treatments that can help make your life easier and more pleasant. CAUSES  Many things can cause an overactive bladder. Possibilities include:  Urinary tract infection or infection of nearby tissues such as the prostate.  Prostate enlargement.  In women, multiple pregnancies or surgery on the uterus or urethra.  Bladder stones, inflammation or tumors.  Caffeine.  Alcohol.  Medications. For example, diuretics (drugs that help the body get rid of extra fluid) increase urine production. Some other medicines must be taken with lots of fluids.  Muscle or nerve weakness. This might be the result of a spinal cord injury, a stroke, multiple sclerosis or Parkinson's disease.  Diabetes can cause a high urine volume which fills the bladder so quickly that the normal urge to urinate is triggered very strongly. SYMPTOMS   Loss of bladder control. You feel the need to urinate and cannot make your body wait.  Sudden, strong urges to urinate.  Urinating 8 or more times a day.  Waking up to urinate two or more times a night. DIAGNOSIS  To decide if you have overactive bladder, your healthcare provider will probably:  Ask about symptoms you have noticed.  Ask about your overall health. This will include questions about any medications you are taking.  Do a physical examination. This will help determine if there are obvious blockages or other problems.  Order some tests. These might include:  A  blood test to check for diabetes or other health issues that could be contributing to the problem.  Urine testing. This could measure the flow of urine and the pressure on the bladder.  A test of your neurological system (the brain, spinal cord and nerves). This is the system that senses the need to urinate. Some of these tests are called flow tests,  bladder pressure tests and electrical measurements of the sphincter muscle.  A bladder test to check whether it is emptying completely when you urinate.  Cytoscopy. This test uses a thin tube with a tiny camera on it. It offers a look inside your urethra and bladder to see if there are problems.  Imaging tests. You might be given a contrast dye and then asked to urinate. X-rays are taken to see how your bladder is working. TREATMENT  An overactive bladder can be treated in many ways. The treatment will depend on the cause. Whether you have a mild or severe case also makes a difference. Often, treatment can be given in your healthcare provider's office or clinic. Be sure to discuss the different options with your caregiver. They include:  Behavioral treatments. These do not involve medication or surgery:  Bladder training. For this, you would follow a schedule to urinate at regular intervals. This helps you learn to control the urge to urinate. At first, you might be asked to wait a few minutes after feeling the urge. In time, you should be able to schedule bathroom visits an hour or more apart.  Kegel exercises. These exercises strengthen the pelvic floor muscles, which support the bladder. By toning these muscles, they can help control urination, even if the bladder muscles are overactive. A specialist will teach you how to do these exercises correctly. They will require daily practice.  Weight loss. If you are obese or overweight, losing weight might stop your bladder from being overactive. Talk to your healthcare provider about how many pounds you should lose. Also ask if there is a specific program or method that would work best for you.  Diet change. This might be suggested if constipation is making your overactive bladder worse. Your healthcare provider or a nutritionist can explain ways to change what you eat to ease constipation. Other people might need to take in less caffeine or alcohol.  Sometimes drinking fewer fluids is needed, too.  Protection. This is not an actual treatment. But, you could wear special pads to take care of any leakage while you wait for other treatments to take effect. This will help you avoid embarrassment.  Physical treatments.  Electrical stimulation. Electrodes will send gentle pulses to the nerves or muscles that help control the bladder. The goal is to strengthen them. Sometimes this is done with the electrodes outside of the body. Or, they might be placed inside the body (implanted). This treatment can take several months to have an effect.  Medications. These are usually used along with other treatments. Several medicines are available. Some are injected into the muscles involved in urination. Others come in pill form. Medications sometimes prescribed include:  Anticholinergics. These drugs block the signals that the nerves deliver to the bladder. This keeps it from releasing urine at the wrong time. Researchers think the drugs might help in other ways, too.  Imipramine. This is an antidepressant. But, it relaxes bladder muscles.  Botox. This is still experimental. Some people believe that injecting it into the bladder muscles will relax them so they work more normally. It has  also been injected into the sphincter muscle when the sphincter muscle does not open properly. This is a temporary fix, however. Also, it might make matters worse, especially in older people.  Surgery.  A device might be implanted to help manage your nerves. It works on the nerves that signal when you need to urinate.  Surgery is sometimes needed with electrical stimulation. If the electrodes are implanted, this is done through surgery.  Sometimes repairs need to be made through surgery. For example, the size of the bladder can be changed. This is usually done in severe cases only. HOME CARE INSTRUCTIONS   Take any medications your healthcare provider prescribed or  suggested. Follow the directions carefully.  Practice any lifestyle changes that are recommended. These might include:  Drinking less fluid or drinking at different times of the day. If you need to urinate often during the night, for example, you may need to stop drinking fluids early in the evening.  Cutting down on caffeine or alcohol. They can both make an overactive bladder worse. Caffeine is found in coffee, tea and sodas.  Doing Kegel exercises to strengthen muscles.  Losing weight, if that is recommended.  Eating a healthy and balanced diet. This will help you avoid constipation.  Keep a journal or a log. You might be asked to record how much you drink and when, and also when you feel the need to urinate.  Learn how to care for implants or other devices, such as pessaries. SEEK MEDICAL CARE IF:   Your overactive bladder gets worse.  You feel increased pain or irritation when you urinate.  You notice blood in your urine.  You have questions about any medications or devices that your healthcare provider recommended.  You notice blood, pus or swelling at the site of any test or treatment procedure.  You have an oral temperature above 102 F (38.9 C). SEEK IMMEDIATE MEDICAL CARE IF:  You have an oral temperature above 102 F (38.9 C), not controlled by medicine. Document Released: 05/05/2009 Document Revised: 10/01/2011 Document Reviewed: 05/05/2009 [facility] Patient Information 2015 [place], [place]. This information is not intended to replace advice given to you by your health care provider. Make sure you discuss any questions you have with your health care provider.

## 2014-01-23 LAB — URINE CULTURE
COLONY COUNT: NO GROWTH
Culture: NO GROWTH
SPECIAL REQUESTS: NORMAL

## 2014-01-25 ENCOUNTER — Ambulatory Visit: Payer: Federal, State, Local not specified - PPO

## 2014-01-25 ENCOUNTER — Encounter: Payer: Self-pay | Admitting: Gynecologic Oncology

## 2014-01-25 ENCOUNTER — Ambulatory Visit: Payer: Federal, State, Local not specified - PPO | Attending: Gynecologic Oncology | Admitting: Gynecologic Oncology

## 2014-01-25 VITALS — BP 136/80 | HR 88 | Temp 97.4°F | Resp 20 | Ht 67.0 in | Wt 146.4 lb

## 2014-01-25 DIAGNOSIS — C539 Malignant neoplasm of cervix uteri, unspecified: Secondary | ICD-10-CM

## 2014-01-25 DIAGNOSIS — R339 Retention of urine, unspecified: Secondary | ICD-10-CM | POA: Insufficient documentation

## 2014-01-25 MED ORDER — DOVER INTERMIT FEMALE CATHETER MISC: 1.0000 | Freq: Three times a day (TID) | Status: DC

## 2014-01-25 NOTE — Progress Notes (Signed)
HPI:  Diamond Calhoun is a 43 y.o. year old who is 2 weeks (13 days) s/p robotic type III radical hysterectomy and lymphadenectomy for stage IB1 adenocarcinoma of the cervix. Her surgery was uncomplicated, however, at day 7 when she returned for a voiding trial and foley removal she was unable to adequately void and her foley was replaced with a plan for 1 additional week of bladder rest. She began feeling discomfort which was felt to be secondary to foley irritation, and went to the [facility] ER this past weekend (July 5th). The foley was removed and the patient was given a voiding trial which she passed. The foley was left out. Since that time she has reported a sensation of incomplete voiding and post micturition dribble. She has some occasional small volume leakage of urine in between voids. She has no pain and is feeling generally well. She has no fevers. She has no dysuria.  Her final pathologic diagnosis is a Stage IB1 adenocarcinoma of the cervix. She requires no adjuvant therapy.    She is seen today to be evaluated for urinary retention and incontinence.   Review of systems: Constitutional:  She has no weight gain or weight loss. She has no fever or chills. Eyes: No blurred vision Ears, Nose, Mouth, Throat: No dizziness, headaches or changes in hearing. No mouth sores. Cardiovascular: No chest pain, palpitations or edema. Respiratory:  No shortness of breath, wheezing or cough Gastrointestinal: She has normal bowel movements without diarrhea or constipation. She denies any nausea or vomiting. She denies blood in her stool or heart burn. Genitourinary:  See HPI. She denies pelvic pain, pelvic pressure. She has no irregular vaginal bleeding or vaginal discharge. She is unsure if the leakage of urine is from the vagina. Musculoskeletal: Denies muscle weakness or joint pains.  Skin:  She has no skin changes, rashes or itching Neurological:  Denies dizziness or headaches. No neuropathy, no numbness  or tingling. Psychiatric:  She denies depression or anxiety. Hematologic/Lymphatic:   No easy bruising or bleeding   Physical Exam: Blood pressure 136/80, pulse 88, temperature 97.4 F (36.3 C), temperature source Oral, resp. rate 20, height 5\' 7"  (1.702 m), weight 146 lb 6.4 oz (66.407 kg), last menstrual period 01/03/2014. General: Well dressed, well nourished in no apparent distress.   HEENT:  Normocephalic and atraumatic, no lesions.  Extraocular muscles intact. Sclerae anicteric. Pupils equal, round, reactive. No mouth sores or ulcers. Thyroid is normal size, not nodular, midline. Skin:  No lesions or rashes. Breasts:  Soft, symmetric.  No skin or nipple changes.  No palpable LN or masses. Lungs:  Clear to auscultation bilaterally.  No wheezes. Cardiovascular:  Regular rate and rhythm.  No murmurs or rubs. Abdomen:  Soft, nontender, nondistended.  No palpable masses.  No hepatosplenomegaly.  No ascites. Normal bowel sounds.  No hernias.  Incisions are clean dry and in tact Genitourinary: Normal EGBUS  Vaginal cuff intact.  No bleeding. Scant volume of discharge in vault - collected for creatinine, but insuficient fluid to run this test.   Extremities: No cyanosis, clubbing or edema.  No calf tenderness or erythema. No palpable cords. Psychiatric: Mood and affect are appropriate. Neurological: Awake, alert and oriented x 3. Sensation is intact, no neuropathy.  Musculoskeletal: No pain, normal strength and range of motion.  A foley was inserted in a sterile fashion into the urinary bladder. The patient had voided immediately prior to foley placement and felt "empty". 600cc of amber (nonbloody) urine returned via  the foley. The bladder was retrograde filled with 360cc of sterile methylene blue stained staline. A guaze was placed in the vagina. The patient ambulated for 5 minutes and then the guaze was removed. There was no blue on the guaze (indicating no vesicovaginal fistula). The patient  was asked to void and successfully voided 200cc of the 360cc that was placed.  Assessment:    43 y.o. year old with Stage IB1 cervical adenocarcinoma, 2 weeks s/p robotic radical hysterectomy and lymphadenectomy, now presenting with urinary retention and overflow incontinence. I discussed the etiology of this condition (interruption of hypogastric nerves during the radical parametrial dissection) and the typical temporary nature of this condition. I discussed that in the interim it was critical that Diamond Calhoun keep her bladder from being overdistended as this could result in more permanent flaccidity of the detrusor muscle. Therefore I stressed the importance of intermittent self catheterization.  Plan: 1) Pathology reports reviewed today 2) I counseled Diamond Calhoun that at least 3 times per day she should self cath after voiding to empty her residual. She should ideally record the volume of this residual so that, over time, she can monitor for improvement and reduce the frequency of intermittent self catheterizations. I instructed her regarding clean technique and provided her with a script for supplies. I counseled Diamond Calhoun that there was no evidence for VVF on today's exam, however, ureterovaginal fistula cannot be ruled out (though her symptoms are not consistent with this). I discussed that if she notices increasing leakage of urine in between voids, particularly nocturnal incontinence, she should immediately notify me, in which case I would order a CT urogram to ensure there was no UVF. I will plan to see Diamond Calhoun in 1 month to monitor for return of bladder function and postoperative recovery.    3)  Return to clinic in 1 month  Donaciano Eva, MD

## 2014-01-25 NOTE — Patient Instructions (Signed)
Clean Intermittent Catheterization, Female Clean intermittent catheterization (CIC) refers to emptying urine from the bladder with a small, flexible tube (catheter). The bladder is an organ in the body that stores urine. Urine may need to be drained from your bladder with a catheter if:  There is an obstruction to the flow of urine out of the bladder or through the urinary tract.  The bladder muscles or nerves are not functioning properly to allow normal flow of urine out of the bladder. Emptying the bladder regularly will help prevent further permanent bladder or kidney damage. SUPPLIES FOR CIC You will need:  The specific type and size of catheter as directed by your caregiver.  Water-soluble, lubricating jelly (if the catheter is not pre-lubricated). Do not use an oil-based lubricant.  A warm, soapy washcloth or pre-moistened wipes.  A container to collect the urine (if you are not using the toilet).  A container or bag to store the catheter. HOW TO PERFORM CIC Follow these steps for a clean technique: 1. Collect supplies and place them within your reach. 2. Wash your hands thoroughly with soap and water. 3. Get in a comfortable position. Positions include:  Sitting forward-facing or backward-facing on a toilet, wheelchair, chair, or edge of bed. It may be helpful to sit forward-facing with a mirror on a stool positioned to help you view the opening of the urethra. Or sit backward-facing on a toilet with a mirror positioned between the toilet lid and toilet seat to help you view the opening of the urethra.  Standing beside the toilet with one foot on the toilet rim.  Lying down with your head raised on pillows. 4. Position the collection container between your legs (if you are not using the toilet). 5. Urinate (if you are able). 6. Place water-soluble, lubricating jelly on about 2 inches (5 cm) of the tip of the catheter (if the catheter is not pre-lubricated). 7. Set the  catheter down on a clean, dry surface within reach. 8. Spread the labia folds open. 9. Wash the labia with the warm, soapy washcloth or pre-moistened wipes. Wash from front to back. 10. Hold the labia open with one hand. 11. Relax. 12. Insert the catheter gently into the urethra opening in an upward and backward direction until urine starts to flow, usually 2 to 3 inches (5 to 8 cm). 13. When urine starts to flow, insert the catheter about 1 inch (3 cm) more. 14. When the urine stops flowing, strain or gently push on the lower abdominal muscles to help the bladder drain fully. 15. Gently pull the catheter out. 16. Wash the labia and genital area. 17. Report any changes in the urine to your caregiver. 18. Discard the urine. 19. If you are using a multiple use catheter, wash the catheter as directed by your caregiver. Rinse. Allow to air dry. Store the catheter in a clean, dry container or bag. 20. Wash your hands. HOME CARE INSTRUCTIONS  Drink 6 to 8 glasses of fluid each day. Avoid caffeine. Caffeine may make you have to urinate more frequently and more urgently.  Empty your bladder every 4 to 6 hours or as directed by your caregiver.  Perform a CIC if you have symptoms of too much urine in your bladder (overdistension), and you are not able to urinate. Symptoms of overdistension include:  Restlessness.  Sweating or chills.  Headache.  Flushed or pale color.  Cold limbs.  Bloated lower abdomen.  Discard a multiple use catheter when it becomes  dry, brittle, or cloudy (usually after 1 week of use).  Take all medications as directed by your caregiver. SEEK MEDICAL CARE IF:  You are having trouble performing any of the steps.  You are leaking urine.  You have pain when you urinate.  You notice blood in your urine.  You feel the need to empty your bladder (void) often.  Your urine is cloudy or smells different.  You have pain in your abdomen.  You develop a rash or  sores. SEEK IMMEDIATE MEDICAL CARE IF:  You have a fever or persistent symptoms for more than 72 hours.  You have a fever and your symptoms suddenly get worse.  Your pain becomes severe. Document Released: 08/11/2010 Document Revised: 03/11/2013 Document Reviewed: 08/11/2010 [facility] Patient Information 2015 [place], [place]. This information is not intended to replace advice given to you by your health care provider. Make sure you discuss any questions you have with your health care provider.

## 2014-02-02 ENCOUNTER — Other Ambulatory Visit: Payer: Self-pay | Admitting: *Deleted

## 2014-02-02 DIAGNOSIS — N390 Urinary tract infection, site not specified: Secondary | ICD-10-CM

## 2014-02-03 ENCOUNTER — Other Ambulatory Visit: Payer: Federal, State, Local not specified - PPO

## 2014-02-03 DIAGNOSIS — N390 Urinary tract infection, site not specified: Secondary | ICD-10-CM

## 2014-02-03 LAB — URINALYSIS, MICROSCOPIC - CHCC
BILIRUBIN (URINE): NEGATIVE
BLOOD: NEGATIVE
Glucose: NEGATIVE mg/dL
KETONES: NEGATIVE mg/dL
Nitrite: NEGATIVE
PH: 6 (ref 4.6–8.0)
Protein: NEGATIVE mg/dL
RBC / HPF: NEGATIVE (ref 0–2)
Specific Gravity, Urine: 1.015 (ref 1.003–1.035)
Urobilinogen, UR: 0.2 mg/dL (ref 0.2–1)

## 2014-02-05 ENCOUNTER — Telehealth: Payer: Self-pay | Admitting: *Deleted

## 2014-02-05 LAB — URINE CULTURE

## 2014-02-05 NOTE — Telephone Encounter (Signed)
UA results reviewed by MD, notified pt to push fluids, take ibuprofen or tylenol for pain/discomfort, monitor for fever or changes. Pt verbalized understanding no further concerns.

## 2014-03-01 ENCOUNTER — Ambulatory Visit: Payer: Federal, State, Local not specified - PPO | Attending: Gynecologic Oncology | Admitting: Gynecologic Oncology

## 2014-03-01 ENCOUNTER — Encounter: Payer: Self-pay | Admitting: Gynecologic Oncology

## 2014-03-01 VITALS — BP 122/84 | HR 74 | Temp 98.6°F | Resp 20 | Ht 67.0 in | Wt 145.5 lb

## 2014-03-01 DIAGNOSIS — Z8541 Personal history of malignant neoplasm of cervix uteri: Secondary | ICD-10-CM | POA: Insufficient documentation

## 2014-03-01 DIAGNOSIS — R339 Retention of urine, unspecified: Secondary | ICD-10-CM | POA: Insufficient documentation

## 2014-03-01 DIAGNOSIS — N9989 Other postprocedural complications and disorders of genitourinary system: Secondary | ICD-10-CM | POA: Insufficient documentation

## 2014-03-01 DIAGNOSIS — F411 Generalized anxiety disorder: Secondary | ICD-10-CM | POA: Insufficient documentation

## 2014-03-01 DIAGNOSIS — Y836 Removal of other organ (partial) (total) as the cause of abnormal reaction of the patient, or of later complication, without mention of misadventure at the time of the procedure: Secondary | ICD-10-CM | POA: Insufficient documentation

## 2014-03-01 DIAGNOSIS — Z79899 Other long term (current) drug therapy: Secondary | ICD-10-CM | POA: Insufficient documentation

## 2014-03-01 DIAGNOSIS — IMO0002 Reserved for concepts with insufficient information to code with codable children: Secondary | ICD-10-CM | POA: Insufficient documentation

## 2014-03-01 DIAGNOSIS — Y9210 Unspecified residential institution as the place of occurrence of the external cause: Secondary | ICD-10-CM | POA: Insufficient documentation

## 2014-03-01 DIAGNOSIS — C539 Malignant neoplasm of cervix uteri, unspecified: Secondary | ICD-10-CM

## 2014-03-01 NOTE — Patient Instructions (Signed)
Follow up in 3 months

## 2014-03-01 NOTE — Progress Notes (Signed)
HPI:  Diamond Calhoun is a 43 y.o. year old who is 2 months s/p robotic type III radical hysterectomy and lymphadenectomy (on 01/12/14) for stage IB1 adenocarcinoma of the cervix. Her surgery was uncomplicated, however, at day 7 when she returned for a voiding trial and foley removal she was unable to adequately void and her foley was replaced with a plan for 1 additional week of bladder rest. She began feeling discomfort which was felt to be secondary to foley irritation, and went to the [facility] ER on July 5th. The foley was removed and the patient was given a voiding trial which she passed. The foley was left out. She then reported a sensation of incomplete voiding and post micturition dribble. On 01/26/14 she was evaluated in clinic and found to have significant post-void residual (600cc) and decreased sensation to voiding. She tested negative in the office for vesicovaginal fistula. She was instructed on intermittent self-catheterization and has been doing this at least 3 times a day (or more frequent if she lacks an urge to void).   In the past month she notes no real pattern of improvement with bladder function. She has some days in which she voids adequately with 60cc residual. Other days she voids 400 and has 400 residual on post void cath. There is no incontinence or postmicturition dribble. There is no dysuria, fever, flank pain. She has occasional bladder irritation made better with piridium.  She also reports edema in the right groin area. It is intermittent. It is not painful. She denies lower extremity edema.  Current Outpatient Prescriptions on File Prior to Visit  Medication Sig Dispense Refill  . ALPRAZolam (XANAX) 0.25 MG tablet Take 0.125 mg by mouth 2 (two) times daily as needed for anxiety.       . Catheters (DOVER INTERMIT FEMALE CATHETER) MISC 1 Container by Does not apply route 3 (three) times daily.  100 each  3  . enoxaparin (LOVENOX) 40 MG/0.4ML injection Inject 40 mg into the skin  daily.      Marland Kitchen ibuprofen (ADVIL,MOTRIN) 200 MG tablet Take 400 mg by mouth every 6 (six) hours as needed (Pain).      . polyethylene glycol (MIRALAX / GLYCOLAX) packet Take 17 g by mouth daily.       No current facility-administered medications on file prior to visit.   Past Medical History  Diagnosis Date  . Anxiety   . Cancer     cervical ca dx'd 2015  . Headache(784.0)     MIGRAINES  . Tinnitus   . Abdominal pain, recurrent     RUQ  AROUND TO BACK   Past Surgical History  Procedure Laterality Date  . Benign tumor removed in 1988  1988    Parotid tumor removed   . Cholecystectomy    . C-secton  12/07/2004  . Robotic assisted total hysterectomy with bilateral salpingo oopherectomy N/A 01/12/2014    Procedure: ROBOTIC ASSISTED LAPAROSCOPIC RADICAL HYSECTOMEY BILATERAL SALPINGECTOMY AND PELVIC LYMPHADENECTOMY;  Surgeon: Imagene Gurney A. Alycia Rossetti, MD;  Location: [facility];  Service: Gynecology;  Laterality: N/A;   Family History  Problem Relation Age of Onset  . Anemia Brother   . Cancer Paternal Aunt   . Cancer Maternal Grandmother   . Cancer Maternal Grandfather   . Cancer Paternal Grandfather    History  Substance Use Topics  . Smoking status: Never Smoker   . Smokeless tobacco: Not on file     Comment: " Smoked some in college"    .  Alcohol Use: No     Review of systems: Constitutional:  She has no weight gain or weight loss. She has no fever or chills. Eyes: No blurred vision Ears, Nose, Mouth, Throat: No dizziness, headaches or changes in hearing. No mouth sores. Cardiovascular: No chest pain, palpitations or edema. Respiratory:  No shortness of breath, wheezing or cough Gastrointestinal: She has normal bowel movements without diarrhea or constipation. She denies any nausea or vomiting. She denies blood in her stool or heart burn. Genitourinary:  See HPI. She denies pelvic pain, pelvic pressure. She has no irregular vaginal bleeding or vaginal discharge.  Musculoskeletal:  Denies muscle weakness or joint pains.  Skin:  She has no skin changes, rashes or itching Neurological:  Denies dizziness or headaches. No neuropathy, no numbness or tingling. Psychiatric:  She denies depression or anxiety. Hematologic/Lymphatic:   No easy bruising or bleeding. + edema in right groin.   Physical Exam: Blood pressure 122/84, pulse 74, temperature 98.6 F (37 C), temperature source Oral, resp. rate 20, height 5\' 7"  (1.702 m), weight 145 lb 8 oz (65.998 kg). General: Well dressed, well nourished in no apparent distress.   HEENT:  Normocephalic and atraumatic, no lesions.  Extraocular muscles intact. Sclerae anicteric. Pupils equal, round, reactive. No mouth sores or ulcers. Thyroid is normal size, not nodular, midline. Skin:  No lesions or rashes. Breasts:  Soft, symmetric.  No skin or nipple changes.  No palpable LN or masses. Lungs:  Clear to auscultation bilaterally.  No wheezes. Cardiovascular:  Regular rate and rhythm.  No murmurs or rubs. Abdomen:  Soft, nontender, nondistended.  No palpable masses.  No hepatosplenomegaly.  No ascites. Normal bowel sounds.  No hernias.  Incisions are clean dry and in tact Genitourinary: Normal EGBUS  Vaginal cuff intact.  No bleeding. No fluid in vagina. Vaginal cuff intact and healed. Granulation tissue present at right half of cuff. Uterus and cervix surgically absent. Extremities: No cyanosis, clubbing or edema.  No calf tenderness or erythema. No palpable cords. Psychiatric: Mood and affect are appropriate. Neurological: Awake, alert and oriented x 3. Sensation is intact, no neuropathy.  Musculoskeletal: No pain, normal strength and range of motion.  Assessment & Plan:    43 y.o. year old with Stage IB1 cervical adenocarcinoma, 2 months s/p robotic radical hysterectomy and lymphadenectomy on 04/15/25, complicated by postop urinary retention and overflow incontinence. Here today for postoperative evaluation. She is healing well from  surgery. I cleared her to return to all activities as limited by comfort. I discussed that I expect her urinary retention will improve gradually over time, however, if she notes no trend towards improvement in 1 month she should call our office and we will facilitate a referral with a urologist/urogynecologist for more testing and possible intervention. She should continue to self cath after voids or 3 times per day until she notes strong urge to void, and postvoid residuals consistently <25% of voided volume.  With respect to her cervical cancer, I discussed that she does not require adjuvant therapy to reduce the risk for recurrence. I discussed that she should undergo close surveillance with 3 monthly exams and symptom review. We reviewed symptoms concerning for recurrence (including vaginal bleeding, pelvic or abdominal pains, new lower extremity edema, cough, change in bladder or bowel habit, or weight loss) and the patient was informed to contact us if she notes these symptoms in between her scheduled surveillance visits. She should return to see Dr Alycia Rossetti or myself in 3 months.  Donaciano Eva, MD

## 2014-03-11 ENCOUNTER — Telehealth: Payer: Self-pay | Admitting: *Deleted

## 2014-03-11 ENCOUNTER — Telehealth: Payer: Self-pay | Admitting: Gynecologic Oncology

## 2014-03-11 ENCOUNTER — Other Ambulatory Visit: Payer: Self-pay | Admitting: Gynecologic Oncology

## 2014-03-11 DIAGNOSIS — C539 Malignant neoplasm of cervix uteri, unspecified: Secondary | ICD-10-CM

## 2014-03-11 DIAGNOSIS — N898 Other specified noninflammatory disorders of vagina: Secondary | ICD-10-CM

## 2014-03-11 DIAGNOSIS — M545 Low back pain, unspecified: Secondary | ICD-10-CM

## 2014-03-11 DIAGNOSIS — R8290 Unspecified abnormal findings in urine: Secondary | ICD-10-CM

## 2014-03-11 NOTE — Telephone Encounter (Addendum)
Patient called reporting chalky white urine, worse in the am, for the past several days.  Stating her urine clears after drinking water later in the day.  Denies fever, chills, nausea, vomiting.  Some lower back pain for the past several days.  She is still in and out cathing herself.  She also reports having sensations of urine gushing from her vagina.  She inserted a tampon that ultimately became saturated with urine.  Dr. Denman George notified.  Patient to come in tomorrow to give a urine sample.  Patient to also have a cystogram per Dr. Denman George to evaluate for leak at the vaginal cuff.

## 2014-03-11 NOTE — Telephone Encounter (Signed)
Called pt to advised CT scan of kidney Monday 8/24 1:30 pt verbalized understanding.

## 2014-03-12 ENCOUNTER — Telehealth: Payer: Self-pay | Admitting: Gynecologic Oncology

## 2014-03-12 ENCOUNTER — Other Ambulatory Visit: Payer: Federal, State, Local not specified - PPO

## 2014-03-12 DIAGNOSIS — N390 Urinary tract infection, site not specified: Secondary | ICD-10-CM

## 2014-03-12 DIAGNOSIS — M545 Low back pain, unspecified: Secondary | ICD-10-CM

## 2014-03-12 DIAGNOSIS — R8290 Unspecified abnormal findings in urine: Secondary | ICD-10-CM

## 2014-03-12 LAB — URINALYSIS, MICROSCOPIC - CHCC
BILIRUBIN (URINE): NEGATIVE
Blood: NEGATIVE
GLUCOSE UR CHCC: NEGATIVE mg/dL
Ketones: NEGATIVE mg/dL
NITRITE: POSITIVE
Protein: NEGATIVE mg/dL
Specific Gravity, Urine: 1.01 (ref 1.003–1.035)
UROBILINOGEN UR: 0.2 mg/dL (ref 0.2–1)
pH: 6.5 (ref 4.6–8.0)

## 2014-03-12 MED ORDER — CIPROFLOXACIN HCL 500 MG PO TABS: 500.0000 mg | ORAL_TABLET | Freq: Two times a day (BID) | ORAL | Status: DC

## 2014-03-12 NOTE — Telephone Encounter (Signed)
Patient notified of UA results and Dr. Serita Grit recommendations for 7 day course of Cipro.  Reportable signs and symptoms reviewed.  Advised about potential side effects including achilles tendon rupture.  She is to call for any questions or concerns and she will be notified of urine culture results.

## 2014-03-15 ENCOUNTER — Telehealth: Payer: Self-pay | Admitting: *Deleted

## 2014-03-15 ENCOUNTER — Ambulatory Visit (HOSPITAL_COMMUNITY)
Admission: RE | Admit: 2014-03-15 | Discharge: 2014-03-15 | Disposition: A | Discharge status: Home / Self Care | Payer: Federal, State, Local not specified - PPO | Source: Ambulatory Visit | Attending: Gynecologic Oncology | Admitting: Gynecologic Oncology

## 2014-03-15 ENCOUNTER — Other Ambulatory Visit: Payer: Self-pay | Admitting: Gynecologic Oncology

## 2014-03-15 ENCOUNTER — Encounter (HOSPITAL_COMMUNITY): Payer: Self-pay

## 2014-03-15 DIAGNOSIS — C539 Malignant neoplasm of cervix uteri, unspecified: Secondary | ICD-10-CM

## 2014-03-15 DIAGNOSIS — R32 Unspecified urinary incontinence: Secondary | ICD-10-CM

## 2014-03-15 DIAGNOSIS — N898 Other specified noninflammatory disorders of vagina: Secondary | ICD-10-CM

## 2014-03-15 LAB — URINE CULTURE

## 2014-03-15 MED ORDER — IOHEXOL 300 MG/ML  SOLN
100.0000 mL | Freq: Once | INTRAMUSCULAR | Status: AC | PRN
  Administered 2014-03-15: 100 mL via INTRAVENOUS

## 2014-03-15 NOTE — Telephone Encounter (Signed)
Referral to [facility] made appt with Dr. Matilde Sprang aug 30 at 0800. Called pt unable to reach [facility]OV[facility] for pt to call office.

## 2014-03-15 NOTE — Telephone Encounter (Signed)
Message copied by Lucile Crater on Mon Mar 15, 2014  4:22 PM ------      Message from: Everitt Amber C      Created: Mon Mar 15, 2014  4:16 PM       [facility],      Maurianna's CT was negative for fistula which is good news. I believe her symptoms of leakage are actually transurethral intermittent incontinence rather than a fistula.      If she would like to see somebody from urology to discuss this further (and maybe hasten the return of function) we could arrange for her to see somebody.      I would recommend Dr Allen Kell from [facility] here in [place].      Or Dr Maryland Pink who is a Urogynecologist who just relocated to [facility].            Thanks      Terrence Dupont ------

## 2014-03-16 ENCOUNTER — Telehealth: Payer: Self-pay | Admitting: *Deleted

## 2014-03-16 NOTE — Telephone Encounter (Signed)
Called pt clarified appt date is Monday 8/31. Pt request appt time changed, called [facility] changed appt time to 10am. Pt is to arrive at 0945 8/31. Pt confirmed appt date/time

## 2014-03-26 ENCOUNTER — Other Ambulatory Visit: Payer: Self-pay | Admitting: *Deleted

## 2014-03-26 ENCOUNTER — Encounter: Payer: Self-pay | Admitting: Gynecologic Oncology

## 2014-03-26 DIAGNOSIS — R339 Retention of urine, unspecified: Secondary | ICD-10-CM

## 2014-03-26 MED ORDER — DOVER INTERMIT FEMALE CATHETER MISC: 1.0000 | Freq: Three times a day (TID) | Status: DC

## 2014-03-30 ENCOUNTER — Encounter: Payer: Self-pay | Admitting: Gynecologic Oncology

## 2014-03-30 NOTE — Progress Notes (Signed)
Operative note should read oophoropexy and not oophorectomy.

## 2014-05-05 ENCOUNTER — Encounter: Payer: Self-pay | Admitting: Gynecologic Oncology

## 2014-05-11 ENCOUNTER — Telehealth: Payer: Self-pay | Admitting: Gynecologic Oncology

## 2014-05-11 NOTE — Telephone Encounter (Signed)
LM for patient to call me back regarding urinary symptoms.

## 2014-05-12 ENCOUNTER — Other Ambulatory Visit: Payer: Self-pay | Admitting: Gynecologic Oncology

## 2014-05-12 DIAGNOSIS — R3989 Other symptoms and signs involving the genitourinary system: Secondary | ICD-10-CM

## 2014-05-12 MED ORDER — LIDOCAINE HCL 2 % EX GEL: 1.0000 "application " | CUTANEOUS | Status: DC | PRN

## 2014-05-12 NOTE — Progress Notes (Signed)
Order per Dr. Alycia Rossetti who spoke with the patient via telephone.

## 2014-05-26 ENCOUNTER — Other Ambulatory Visit (HOSPITAL_COMMUNITY)
Admission: RE | Admit: 2014-05-26 | Discharge: 2014-05-26 | Disposition: A | Discharge status: Home / Self Care | Payer: Federal, State, Local not specified - PPO | Source: Ambulatory Visit | Attending: Gynecologic Oncology | Admitting: Gynecologic Oncology

## 2014-05-26 ENCOUNTER — Ambulatory Visit: Payer: Federal, State, Local not specified - PPO | Attending: Gynecologic Oncology | Admitting: Gynecologic Oncology

## 2014-05-26 ENCOUNTER — Encounter: Payer: Self-pay | Admitting: Gynecologic Oncology

## 2014-05-26 VITALS — BP 132/89 | HR 82 | Resp 18 | Ht 67.0 in | Wt 143.2 lb

## 2014-05-26 DIAGNOSIS — C539 Malignant neoplasm of cervix uteri, unspecified: Secondary | ICD-10-CM | POA: Insufficient documentation

## 2014-05-26 DIAGNOSIS — Z9079 Acquired absence of other genital organ(s): Secondary | ICD-10-CM | POA: Diagnosis not present

## 2014-05-26 DIAGNOSIS — Z01411 Encounter for gynecological examination (general) (routine) with abnormal findings: Secondary | ICD-10-CM | POA: Diagnosis not present

## 2014-05-26 DIAGNOSIS — R339 Retention of urine, unspecified: Secondary | ICD-10-CM

## 2014-05-26 DIAGNOSIS — Z90710 Acquired absence of both cervix and uterus: Secondary | ICD-10-CM | POA: Insufficient documentation

## 2014-05-26 NOTE — Patient Instructions (Signed)
We'll notify you of the results of your Pap smear and biopsy. Please return to see Dr. Alycia Rossetti in 4 months.

## 2014-05-26 NOTE — Progress Notes (Signed)
HPI:  Diamond Calhoun is a 43 y.o. year old who is s/p robotic type III radical hysterectomy and lymphadenectomy (on 01/12/14) for stage IB1 adenocarcinoma of the cervix.   ADDITIONAL INFORMATION: 1. The bilateral fallopian tubes are benign. (JSM:gt, 01/25/14) Vicente Males MD Pathologist, [employer]  Diagnosis 1. Uterus and cervix, Bilateral Tubes - INVASIVE ENDOCERVICAL ADENOCARCINOMA, WELL-DIFFERENTIATED, 1.5 CM. - ASSOCIATED ADENOCARCINOMA IN SITU (AIS). - RESECTION MARGINS ARE NEGATIVE. - TWO OF TWO LYMPH NODES NEGATIVE FOR MALIGNANCY (0/2). - SEE ONCOLOGY TABLE. 2. Lymph nodes, regional resection, Lt Pelvic - EIGHT OF EIGHT LYMPH NODES NEGATIVE FOR MALIGNANCY (0/8). 3. Lymph nodes, regional resection, Rt Pelvic - EIGHT OF EIGHT LYMPH NODES NEGATIVE FOR MALIGNANCY (0/8).  Interval History: We have had several conversations with the patient regarding her bladder function. She was having quite a bit of discomfort at the level of the urethra we prescribed lidocaine jelly. That issue is now resolved and she's not sure was related to the lidocaine jelly or change in her catheters. She states she is starting to void more spontaneously. She states that she is voiding almost every day or even several times today she's feeling the urge to void which is new and encouraging her. She is still continuing to self catheter 3 times a day but she has noticed the volume has diminished. She has not had any vaginal bleeding or any of the other change in her bowel habits. She's not sexually active secondary to concerns of pain. She believes that the edema in her groins is also similarly improving over time. Today in the clinic she felt the urge to void and states that she was able to void spontaneously for approximately 9 seconds. She does feel that she is emptying better.  10 point review of systems otherwise negative.  Current Outpatient Prescriptions on File Prior to Visit  Medication Sig  Dispense Refill  . ALPRAZolam (XANAX) 0.25 MG tablet Take 0.125 mg by mouth 2 (two) times daily as needed for anxiety.     . Catheters (DOVER INTERMIT FEMALE CATHETER) MISC 1 Container by Does not apply route 3 (three) times daily. 100 each 6  . lidocaine (XYLOCAINE) 2 % jelly Place 1 application into the urethra as needed. With I&O cath per Dr. Alycia Rossetti 30 mL 5  . polyethylene glycol (MIRALAX / GLYCOLAX) packet Take 17 g by mouth daily.    Marland Kitchen ibuprofen (ADVIL,MOTRIN) 200 MG tablet Take 400 mg by mouth every 6 (six) hours as needed (Pain).     No current facility-administered medications on file prior to visit.   Past Medical History  Diagnosis Date  . Anxiety   . Headache(784.0)     MIGRAINES  . Tinnitus   . Abdominal pain, recurrent     RUQ  AROUND TO BACK  . cervical ca dx'd 12/23/2013   Past Surgical History  Procedure Laterality Date  . Benign tumor removed in 1988  1988    Parotid tumor removed   . Cholecystectomy    . C-secton  12/07/2004  . Robotic assisted total hysterectomy with bilateral salpingo oopherectomy N/A 01/12/2014    Procedure: ROBOTIC ASSISTED LAPAROSCOPIC RADICAL HYSECTOMEY BILATERAL SALPINGECTOMY AND PELVIC LYMPHADENECTOMY;  Surgeon: Imagene Gurney A. Alycia Rossetti, MD;  Location: [facility];  Service: Gynecology;  Laterality: N/A;   Family History  Problem Relation Age of Onset  . Anemia Brother   . Cancer Paternal Aunt   . Cancer Maternal Grandmother   . Cancer Maternal Grandfather   . Cancer Paternal Grandfather  History  Substance Use Topics  . Smoking status: Never Smoker   . Smokeless tobacco: Not on file     Comment: " Smoked some in college"    . Alcohol Use: No     Physical Exam: Blood pressure 132/89, pulse 82, resp. rate 18, height 5\' 7"  (1.702 m), weight 143 lb 3.2 oz (64.955 kg), last menstrual period 01/03/2014. General: Well dressed, well nourished in no apparent distress.    HEENT:  Thyroid is normal size, not nodular, midline.  Skin:  No lesions or  rashes.  Lungs:  Clear to auscultation bilaterally.  No wheezes.  Cardiovascular:  Regular rate and rhythm.   Abdomen:  Soft, nontender, nondistended.  No palpable masses.  No hepatosplenomegaly.  No ascites. Normal bowel sounds.  No hernias.    Genitourinary: Normal EGBUS  Vaginal cuff intact.  No bleeding. No fluid in vagina. Vaginal cuff intact and healed. Granulation tissue present at right half of cuff. It was biopsied and removed. Silver nitrate applied. Pap smear performed. Bimanual examination reveals no masses, nodularity, tenderness. Rectal confirms  Extremities: No cyanosis, clubbing or edema.   Psychiatric: Mood and affect are appropriate.  Assessment & Plan:    43 y.o. year old with Stage IB1 cervical adenocarcinoma s/p robotic radical hysterectomy and lymphadenectomy on 9/52/84, complicated by postop urinary retention and overflow incontinence. With respect to her cervical cancer, I discussed that she does not require adjuvant therapy to reduce the risk for recurrence. I discussed that she should undergo close surveillance with every 4  month exams and symptom review. We reviewed symptoms concerning for recurrence (including vaginal bleeding, pelvic or abdominal pains, new lower extremity edema, cough, change in bladder or bowel habit, or weight loss) and the patient was informed to contact us if she notes these symptoms in between her scheduled surveillance visits.   I will follow-up in results of the granulation tissue biopsy as well as her Pap smear from today notify her of the results. She and I will touch base about 4-6 weeks to ensure that she's having continued improvement with regards to her bladder. If she is doing well she return to see me in 4 months.  Nancy Marus A., MD

## 2014-05-28 ENCOUNTER — Telehealth: Payer: Self-pay | Admitting: Gynecologic Oncology

## 2014-05-28 NOTE — Telephone Encounter (Signed)
Patient notified of biopsy results.  No concerns voiced.  Advised to call for any concerns.  She will be contacted with her pap smear results when available.

## 2014-05-31 LAB — CYTOLOGY - PAP

## 2014-06-01 ENCOUNTER — Telehealth: Payer: Self-pay | Admitting: *Deleted

## 2014-06-01 NOTE — Telephone Encounter (Signed)
Notified pt pap smear results were normal,  pt to call office with any concerns.

## 2014-06-01 NOTE — Telephone Encounter (Signed)
-----   Message from Dorothyann Gibbs, NP sent at 06/01/2014 10:36 AM EST ----- Please let her know that her pap smear is normal.  I already informed her about her biopsy.  Thank you  ----- Message -----    From: [facility]    Sent: 05/27/2014   3:28 PM      To: Dorothyann Gibbs, NP

## 2014-09-23 ENCOUNTER — Other Ambulatory Visit (HOSPITAL_COMMUNITY)
Admission: RE | Admit: 2014-09-23 | Discharge: 2014-09-23 | Disposition: A | Discharge status: Home / Self Care | Payer: Federal, State, Local not specified - PPO | Source: Ambulatory Visit | Attending: Gynecologic Oncology | Admitting: Gynecologic Oncology

## 2014-09-23 ENCOUNTER — Encounter: Payer: Self-pay | Admitting: Gynecologic Oncology

## 2014-09-23 ENCOUNTER — Ambulatory Visit: Payer: Federal, State, Local not specified - PPO | Attending: Gynecologic Oncology | Admitting: Gynecologic Oncology

## 2014-09-23 VITALS — BP 127/72 | HR 77 | Temp 98.4°F | Wt 144.8 lb

## 2014-09-23 DIAGNOSIS — Z08 Encounter for follow-up examination after completed treatment for malignant neoplasm: Secondary | ICD-10-CM | POA: Insufficient documentation

## 2014-09-23 DIAGNOSIS — R1031 Right lower quadrant pain: Secondary | ICD-10-CM

## 2014-09-23 DIAGNOSIS — N39490 Overflow incontinence: Secondary | ICD-10-CM | POA: Insufficient documentation

## 2014-09-23 DIAGNOSIS — Z8541 Personal history of malignant neoplasm of cervix uteri: Secondary | ICD-10-CM | POA: Diagnosis not present

## 2014-09-23 DIAGNOSIS — Z01411 Encounter for gynecological examination (general) (routine) with abnormal findings: Secondary | ICD-10-CM | POA: Insufficient documentation

## 2014-09-23 DIAGNOSIS — N9989 Other postprocedural complications and disorders of genitourinary system: Secondary | ICD-10-CM | POA: Diagnosis not present

## 2014-09-23 DIAGNOSIS — R339 Retention of urine, unspecified: Secondary | ICD-10-CM | POA: Diagnosis not present

## 2014-09-23 DIAGNOSIS — Z90710 Acquired absence of both cervix and uterus: Secondary | ICD-10-CM | POA: Diagnosis not present

## 2014-09-23 DIAGNOSIS — C539 Malignant neoplasm of cervix uteri, unspecified: Secondary | ICD-10-CM

## 2014-09-23 DIAGNOSIS — Z90722 Acquired absence of ovaries, bilateral: Secondary | ICD-10-CM | POA: Diagnosis not present

## 2014-09-23 DIAGNOSIS — F419 Anxiety disorder, unspecified: Secondary | ICD-10-CM | POA: Insufficient documentation

## 2014-09-23 DIAGNOSIS — Z9049 Acquired absence of other specified parts of digestive tract: Secondary | ICD-10-CM | POA: Diagnosis not present

## 2014-09-23 NOTE — Progress Notes (Signed)
HPI:  Diamond Calhoun is a 44 y.o. year old who is s/p robotic type III radical hysterectomy, bilateral salpingectomy, lymphadenectomy and bilateral oophoropexy (on 01/12/14) for stage IB1 adenocarcinoma of the cervix.   ADDITIONAL INFORMATION: 1. The bilateral fallopian tubes are benign. (JSM:gt, 01/25/14) Diamond Males MD Pathologist, [employer]  Diagnosis 1. Uterus and cervix, Bilateral Tubes - INVASIVE ENDOCERVICAL ADENOCARCINOMA, WELL-DIFFERENTIATED, 1.5 CM. - ASSOCIATED ADENOCARCINOMA IN SITU (AIS). - RESECTION MARGINS ARE NEGATIVE. - TWO OF TWO LYMPH NODES NEGATIVE FOR MALIGNANCY (0/2). - SEE ONCOLOGY TABLE. 2. Lymph nodes, regional resection, Lt Pelvic - EIGHT OF EIGHT LYMPH NODES NEGATIVE FOR MALIGNANCY (0/8). 3. Lymph nodes, regional resection, Rt Pelvic - EIGHT OF EIGHT LYMPH NODES NEGATIVE FOR MALIGNANCY (0/8).  Interval History: [name] on 05/26/2014. At that time her Pap smear was negative. She did have a small area of granulation tissue at the top of the vagina that was removed. Pathology was benign. She comes in today for follow-up. She was Self cathetering 3 times a day but is currently now down to 2 times a day. She noticed that when she self catheter she only has about 200 ML's per time. She typically doesn't after her first void in the morning and then at night before she goes to bed overall she feels like her bladder is emptying better and she is able to void with more force that she does still have to Valsalva in order to completely empty her bladder due to the cost of the catheters, she is soaking them in bleach and allowing them to air dry. She has noticed some irritation at the level of the urethra with doing so. She and her husband have attempted sexual intercourse but she states that there is some discomfort. With positions where she is able to control the depth of the symptoms are improved. She denies any vaginal bleeding. She denies any change in her  bowel or bladder habits. She denies any distinct dysuria. She has no fevers.  Review of Systems  Constitutional: Denies fever. Skin: No rash Cardiovascular: No chest pain, shortness of breath, or edema  Pulmonary: No cough  Gastro Intestinal: Reporting intermittent lower abdominal soreness RLU.  No nausea, vomiting, constipation, or diarrhea reported. No bright red blood per rectum or change in bowel movement.  Genitourinary: No frequency, urgency, +/-dysuria.  Denies vaginal bleeding and discharge.  Musculoskeletal: No myalgia, arthralgia, joint swelling or pain.  Neurologic: No weakness Psychology: Her outlook on her diagnosis is slowly improving. She is no longer as angry as she was.   Current Outpatient Prescriptions on File Prior to Visit  Medication Sig Dispense Refill  . ALPRAZolam (XANAX) 0.25 MG tablet Take 0.125 mg by mouth 2 (two) times daily as needed for anxiety.     . Catheters (DOVER INTERMIT FEMALE CATHETER) MISC 1 Container by Does not apply route 3 (three) times daily. 100 each 6  . ibuprofen (ADVIL,MOTRIN) 200 MG tablet Take 400 mg by mouth every 6 (six) hours as needed (Pain).    Marland Kitchen lidocaine (XYLOCAINE) 2 % jelly Place 1 application into the urethra as needed. With I&O cath per Dr. Alycia Calhoun 30 mL 5  . polyethylene glycol (MIRALAX / GLYCOLAX) packet Take 17 g by mouth daily.    Marland Kitchen trimethoprim (TRIMPEX) 100 MG tablet      No current facility-administered medications on file prior to visit.   Past Medical History  Diagnosis Date  . Anxiety   . Headache(784.0)  MIGRAINES  . Tinnitus   . Abdominal pain, recurrent     RUQ  AROUND TO BACK  . cervical ca dx'd 12/23/2013   Past Surgical History  Procedure Laterality Date  . Benign tumor removed in 1988  1988    Parotid tumor removed   . Cholecystectomy    . C-secton  12/07/2004  . Robotic assisted total hysterectomy with bilateral salpingo oopherectomy N/A 01/12/2014    Procedure: ROBOTIC ASSISTED LAPAROSCOPIC  RADICAL HYSECTOMEY BILATERAL SALPINGECTOMY AND PELVIC LYMPHADENECTOMY;  Surgeon: Diamond Gurney A. Alycia Rossetti, MD;  Location: [facility];  Service: Gynecology;  Laterality: N/A;   Family History  Problem Relation Age of Onset  . Anemia Brother   . Cancer Paternal Aunt   . Cancer Maternal Grandmother   . Cancer Maternal Grandfather   . Cancer Paternal Grandfather    History  Substance Use Topics  . Smoking status: Never Smoker   . Smokeless tobacco: Not on file     Comment: " Smoked some in college"    . Alcohol Use: No     Physical Exam: Blood pressure 127/72, pulse 77, temperature 98.4 F (36.9 C), temperature source Oral, weight 144 lb 12.8 oz (65.681 kg), last menstrual period 01/03/2014. General: Well dressed, well nourished in no apparent distress.    HEENT:  Thyroid is normal size, not nodular, midline.  Skin:  No lesions or rashes.  Lungs:  Clear to auscultation bilaterally.  No wheezes.  Cardiovascular:  Regular rate and rhythm.   Abdomen:  Soft, nontender, nondistended.  No palpable masses.  No hepatosplenomegaly.  No ascites. Normal bowel sounds.  No hernias.    Genitourinary: Normal EGBUS  Vaginal cuff intact.  No bleeding. No fluid in vagina. Vaginal cuff intact and healed. Pap smear performed. Bimanual examination reveals no masses, nodularity, tenderness. Rectal confirms  Extremities: No cyanosis, clubbing or edema.   Psychiatric: Mood and affect are appropriate.  Assessment & Plan:    44 y.o. year old with Stage IB1 cervical adenocarcinoma s/p robotic radical hysterectomy and lymphadenectomy on 09/28/63, complicated by postop urinary retention and overflow incontinence. With respect to her cervical cancer, I discussed that she does not require adjuvant therapy to reduce the risk for recurrence. I discussed that she should undergo close surveillance with every 4  month exams and symptom review. We reviewed symptoms concerning for recurrence (including vaginal bleeding, pelvic or  abdominal pains, new lower extremity edema, cough, change in bladder or bowel habit, or weight loss) and the patient was informed to contact us if she notes these symptoms in between her scheduled surveillance visits.   I will follow-up in results of her Pap smear from today notify her of the results. We discussed some of the irritation could be coming from residual bleach on the catheter. She was instructed to cleanse them but that may be to wash off the bleach before she uses a catheter. She states that this had not occurred to her that the bleach would dry and remain on the catheter and then rehydrate when she inserted. We also discussed trying to stop the morning cath she does not have to During the day and is been able to do well. She'll continue to Self catheter at night before going to bed to ensure that there is no urinary retention. We will see if we can discontinue that when she comes back to see me. I believe the right lower quadrant discomfort that she is occasionally feeling may be secondary to the ovary that was  pexed time of her over for a pexy. We will obtain an pelvic ultrasound to evaluate for this and rule out any seroma or other pathology. If she is doing well she return to see me in 4 months.  Nancy Marus A., MD

## 2014-09-28 LAB — CYTOLOGY - PAP

## 2014-09-30 ENCOUNTER — Ambulatory Visit (HOSPITAL_COMMUNITY)
Admission: RE | Admit: 2014-09-30 | Discharge: 2014-09-30 | Disposition: A | Discharge status: Home / Self Care | Payer: Federal, State, Local not specified - PPO | Source: Ambulatory Visit | Attending: Gynecologic Oncology | Admitting: Gynecologic Oncology

## 2014-09-30 ENCOUNTER — Other Ambulatory Visit: Payer: Self-pay | Admitting: Gynecologic Oncology

## 2014-09-30 ENCOUNTER — Telehealth: Payer: Self-pay | Admitting: Nurse Practitioner

## 2014-09-30 ENCOUNTER — Telehealth: Payer: Self-pay | Admitting: Gynecologic Oncology

## 2014-09-30 DIAGNOSIS — Z90710 Acquired absence of both cervix and uterus: Secondary | ICD-10-CM | POA: Insufficient documentation

## 2014-09-30 DIAGNOSIS — R1031 Right lower quadrant pain: Secondary | ICD-10-CM | POA: Diagnosis not present

## 2014-09-30 DIAGNOSIS — C539 Malignant neoplasm of cervix uteri, unspecified: Secondary | ICD-10-CM

## 2014-09-30 NOTE — Telephone Encounter (Signed)
Patient notified of pap smear results.  No concerns voiced.  Advised to call for any questions or concerns.

## 2014-09-30 NOTE — Telephone Encounter (Signed)
Calling patient to report results of PAP 09/23/14.  Generic, non-descriptive message left to call Jurney Overacker at [phone]

## 2014-10-06 ENCOUNTER — Other Ambulatory Visit: Payer: Self-pay | Admitting: Gynecologic Oncology

## 2014-10-06 ENCOUNTER — Telehealth: Payer: Self-pay | Admitting: *Deleted

## 2014-10-06 DIAGNOSIS — C539 Malignant neoplasm of cervix uteri, unspecified: Secondary | ICD-10-CM

## 2014-10-06 DIAGNOSIS — N838 Other noninflammatory disorders of ovary, fallopian tube and broad ligament: Secondary | ICD-10-CM

## 2014-10-06 NOTE — Progress Notes (Signed)
US findings discussed with patient by Dr. Alycia Rossetti.  [place] ordered per Dr. Alycia Rossetti for 6 to 8 weeks for follow up.

## 2014-10-06 NOTE — Telephone Encounter (Signed)
Notified pt of scheduled U/S  Appointment. Pt is scheduled for U/S on 11/24/2014 @ 10:00am. Pt was instructed to arrive at 9:45a.m. with a full bladder. Pt agreed with time and date of appointment

## 2014-10-07 ENCOUNTER — Encounter: Payer: Self-pay | Admitting: Gynecologic Oncology

## 2014-10-12 ENCOUNTER — Other Ambulatory Visit: Payer: Self-pay | Admitting: Obstetrics and Gynecology

## 2014-10-12 DIAGNOSIS — N6310 Unspecified lump in the right breast, unspecified quadrant: Principal | ICD-10-CM

## 2014-10-12 DIAGNOSIS — N6315 Unspecified lump in the right breast, overlapping quadrants: Secondary | ICD-10-CM

## 2014-10-12 DIAGNOSIS — N6459 Other signs and symptoms in breast: Secondary | ICD-10-CM

## 2014-10-14 ENCOUNTER — Ambulatory Visit
Admission: RE | Admit: 2014-10-14 | Discharge: 2014-10-14 | Disposition: A | Discharge status: Home / Self Care | Payer: Federal, State, Local not specified - PPO | Source: Ambulatory Visit | Attending: Obstetrics and Gynecology | Admitting: Obstetrics and Gynecology

## 2014-10-14 ENCOUNTER — Other Ambulatory Visit: Payer: Self-pay | Admitting: Gynecologic Oncology

## 2014-10-14 DIAGNOSIS — N6459 Other signs and symptoms in breast: Secondary | ICD-10-CM

## 2014-10-14 DIAGNOSIS — N6310 Unspecified lump in the right breast, unspecified quadrant: Principal | ICD-10-CM

## 2014-10-14 DIAGNOSIS — N6315 Unspecified lump in the right breast, overlapping quadrants: Secondary | ICD-10-CM

## 2014-11-24 ENCOUNTER — Ambulatory Visit (HOSPITAL_COMMUNITY)
Admission: RE | Admit: 2014-11-24 | Discharge: 2014-11-24 | Disposition: A | Discharge status: Home / Self Care | Payer: Federal, State, Local not specified - PPO | Source: Ambulatory Visit | Attending: Gynecologic Oncology | Admitting: Gynecologic Oncology

## 2014-11-24 DIAGNOSIS — Z8541 Personal history of malignant neoplasm of cervix uteri: Secondary | ICD-10-CM | POA: Insufficient documentation

## 2014-11-24 DIAGNOSIS — N838 Other noninflammatory disorders of ovary, fallopian tube and broad ligament: Secondary | ICD-10-CM

## 2014-11-24 DIAGNOSIS — C539 Malignant neoplasm of cervix uteri, unspecified: Secondary | ICD-10-CM

## 2014-12-09 ENCOUNTER — Telehealth: Payer: Self-pay | Admitting: Gynecologic Oncology

## 2014-12-09 NOTE — Telephone Encounter (Signed)
LM for patient to call me back if she still has questions. PG

## 2015-01-26 ENCOUNTER — Ambulatory Visit: Payer: Federal, State, Local not specified - PPO | Attending: Gynecologic Oncology | Admitting: Gynecologic Oncology

## 2015-01-26 ENCOUNTER — Other Ambulatory Visit (HOSPITAL_COMMUNITY)
Admission: RE | Admit: 2015-01-26 | Discharge: 2015-01-26 | Disposition: A | Discharge status: Home / Self Care | Payer: Federal, State, Local not specified - PPO | Source: Ambulatory Visit | Attending: Gynecologic Oncology | Admitting: Gynecologic Oncology

## 2015-01-26 ENCOUNTER — Encounter: Payer: Self-pay | Admitting: Gynecologic Oncology

## 2015-01-26 VITALS — BP 146/83 | HR 63 | Temp 98.4°F | Resp 18 | Ht 67.0 in | Wt 145.9 lb

## 2015-01-26 DIAGNOSIS — Z01411 Encounter for gynecological examination (general) (routine) with abnormal findings: Secondary | ICD-10-CM | POA: Diagnosis present

## 2015-01-26 DIAGNOSIS — C539 Malignant neoplasm of cervix uteri, unspecified: Secondary | ICD-10-CM | POA: Diagnosis not present

## 2015-01-26 NOTE — Patient Instructions (Signed)
Followup with Dr. Alycia Rossetti in 3 months. Please call sooner if your abdominal/rectal discomfort worsens or with any additional concerns.

## 2015-01-26 NOTE — Progress Notes (Signed)
HPI:  Diamond Calhoun is a 44 y.o. year old who is s/p robotic type III radical hysterectomy, bilateral salpingectomy, lymphadenectomy and bilateral oophoropexy (on 01/12/14) for stage IB1 adenocarcinoma of the cervix.   ADDITIONAL INFORMATION: 1. The bilateral fallopian tubes are benign. (JSM:gt, 01/25/14) Vicente Males MD Pathologist, Electronic Signature  Diagnosis 1. Uterus and cervix, Bilateral Tubes - INVASIVE ENDOCERVICAL ADENOCARCINOMA, WELL-DIFFERENTIATED, 1.5 CM. - ASSOCIATED ADENOCARCINOMA IN SITU (AIS). - RESECTION MARGINS ARE NEGATIVE. - TWO OF TWO LYMPH NODES NEGATIVE FOR MALIGNANCY (0/2). - SEE ONCOLOGY TABLE. 2. Lymph nodes, regional resection, Lt Pelvic - EIGHT OF EIGHT LYMPH NODES NEGATIVE FOR MALIGNANCY (0/8). 3. Lymph nodes, regional resection, Rt Pelvic - EIGHT OF EIGHT LYMPH NODES NEGATIVE FOR MALIGNANCY (0/8).  Interval History: [name] in 3/16.  At that time her Pap smear was negative. Since [name] she has been intermittently catheterizing herself. She has been catheterizing herself more for curiosity than because she feels that she has any residual urine. When she does catheterize herself is about 1-200 mL. Overall she does feel that she has been emptying her bladder better. She does have test strips that she has been checking her urine to evaluate whether or not she has a urinary tract infection. On occasion for leukocyte esterase and nitrites have been weakly positive and she has taken Bactrim. However, she has had an increase in the MCV of her CBC and was recommended that she no longer take the Bactrim. She now takes Keflex which is causing GI distress. She has been complaining of some rectal pain that is prominent with sitting laying or standing. It is a dull pain that is present all the time. If been going on for about 3-4 weeks. Does not appear to change with her bowel or bladder habits. She has noticed some twinges of pain in the right lower quadrant.  She was asked to track these to see if they're related to ovulation. It does not appear to have any specific pattern. On the days for her breast tenderness is a 6-7 her pelvic discomfort is much last in the opposite has also recurred.  Review of Systems  Constitutional: Denies fever. Skin: No rash Cardiovascular: No chest pain, shortness of breath, or edema  Pulmonary: No cough  Gastro Intestinal: Reporting intermittent lower abdominal soreness RLU.  No nausea, vomiting, constipation, or diarrhea reported. No bright red blood per rectum or change in bowel movement.  Genitourinary: No frequency, urgency, +/-dysuria.  Denies vaginal bleeding and discharge.  Musculoskeletal: No myalgia, arthralgia, joint swelling or pain.  Neurologic: No weakness Psychology: No changes   Current Outpatient Prescriptions on File Prior to Visit  Medication Sig Dispense Refill  . ALPRAZolam (XANAX) 0.25 MG tablet Take 0.125 mg by mouth 2 (two) times daily as needed for anxiety.     . Catheters (DOVER INTERMIT FEMALE CATHETER) MISC 1 Container by Does not apply route 3 (three) times daily. 100 each 6  . ibuprofen (ADVIL,MOTRIN) 200 MG tablet Take 400 mg by mouth every 6 (six) hours as needed (Pain).    Marland Kitchen lidocaine (XYLOCAINE) 2 % jelly Place 1 application into the urethra as needed. With I&O cath per Dr. Alycia Rossetti (Patient not taking: Reported on 01/26/2015) 30 mL 5   No current facility-administered medications on file prior to visit.   Past Medical History  Diagnosis Date  . Anxiety   . Headache(784.0)     MIGRAINES  . Tinnitus   . Abdominal pain, recurrent  RUQ  AROUND TO BACK  . cervical ca dx'd 12/23/2013   Past Surgical History  Procedure Laterality Date  . Benign tumor removed in 1988  1988    Parotid tumor removed   . Cholecystectomy    . C-secton  12/07/2004  . Robotic assisted total hysterectomy with bilateral salpingo oopherectomy N/A 01/12/2014    Procedure: ROBOTIC ASSISTED LAPAROSCOPIC  RADICAL HYSECTOMEY BILATERAL SALPINGECTOMY AND PELVIC LYMPHADENECTOMY;  Surgeon: Imagene Gurney A. Alycia Rossetti, MD;  Location: [facility];  Service: Gynecology;  Laterality: N/A;   Family History  Problem Relation Age of Onset  . Anemia Brother   . Cancer Paternal Aunt   . Cancer Maternal Grandmother   . Cancer Maternal Grandfather   . Cancer Paternal Grandfather    History  Substance Use Topics  . Smoking status: Never Smoker   . Smokeless tobacco: Not on file     Comment: " Smoked some in college"    . Alcohol Use: No     Physical Exam: Blood pressure 146/83, pulse 63, temperature 98.4 F (36.9 C), temperature source Oral, resp. rate 18, height 5\' 7"  (1.702 m), weight 145 lb 14.4 oz (66.18 kg), last menstrual period 01/03/2014. General: Well dressed, well nourished in no apparent distress.    HEENT:  Thyroid is normal size, not nodular, midline.  Skin:  No lesions or rashes.  Lungs:  Clear to auscultation bilaterally.  No wheezes.  Cardiovascular:  Regular rate and rhythm.   Abdomen:  Soft, nontender, nondistended.  No palpable masses.  No hepatosplenomegaly.  No ascites. Normal bowel sounds.  No hernias.    Genitourinary: Normal EGBUS  Vaginal cuff intact.  No bleeding. No fluid in vagina. Vaginal cuff intact and healed. Pap smear performed. Bimanual examination reveals no masses, nodularity, tenderness. Rectal confirms  Extremities: No cyanosis, clubbing or edema.   Psychiatric: Mood and affect are appropriate.  Assessment & Plan:    44 y.o. year old with Stage IB1 cervical adenocarcinoma s/p robotic radical hysterectomy and lymphadenectomy on 5/46/50, complicated by postop urinary retention and overflow incontinence. With respect to her cervical cancer, I discussed that she does not require adjuvant therapy to reduce the risk for recurrence. I discussed that she should undergo close surveillance with every 4  month exams and symptom review. We reviewed symptoms concerning for recurrence  (including vaginal bleeding, pelvic or abdominal pains, new lower extremity edema, cough, change in bladder or bowel habit, or weight loss) and the patient was informed to contact us if she notes these symptoms in between her scheduled surveillance visits.   I will follow-up in results of her Pap smear from today notify her of the results. We discussed getting a CT scan to evaluate the rectal pressure that was she was having. At this time she would like to monitor call us if the pain gets worse we'll obtain imaging. She was encouraged to stop cathetering as I do not feel that that is necessary anymore she is very pleased to hear this. She is looking forward to a week at the beach with her family without needing to catheterize herself. She return to see me in 3-4 months. She was congratulated on her 1 year anniversary.  Nancy Marus A., MD

## 2015-01-28 ENCOUNTER — Telehealth: Payer: Self-pay | Admitting: *Deleted

## 2015-01-28 LAB — CYTOLOGY - PAP

## 2015-01-28 NOTE — Telephone Encounter (Signed)
LMOVM- pt pap smear normal. Pt to call office with any concerns.

## 2015-02-22 ENCOUNTER — Telehealth: Payer: Self-pay | Admitting: Gynecologic Oncology

## 2015-02-22 NOTE — Telephone Encounter (Signed)
Returned call to patient.  "I am frustrated and am not sure what to do.  I have been using an in and out cath since surgery a year ago and have had UTIs off and on.  I have seen two different urologists also.  I was taking trimethoprim off and on but stopped taking that after my primary care doctor told me it was making my RBCs large.  I was then switched to keflex and took it off and on.  I bought a test kit at home so I am able to tell when I am getting an infection.  At my last appointment with Dr. Alycia Rossetti about one month ago, I was having about 200 cc residual left in my bladder after urinating and was told to stop using the catheter.  Several weeks after that I began to feel the start of an infection, so I tested my urine and took the keflex for a few days.  I was feeling good for the past week then yesterday, I felt like I was hit with a ton of bricks.  I began having abdominal pain, bladder tenderness when I pushed on my lower stomach.  I tested my urine and it was positive.  I also in and out cathed myself last night and had 400 to 500 cc in my bladder.  I do not want to be on antibiotics my whole like and the antibiotics mess my stomach up.  I can't leave the house and sometimes have diarrhea 5 to 6 times a day."  Advised that the above situation would be discussed with Dr. Alycia Rossetti and she would be contacted with her recommendations.  Advised that she may need to come in and provide a urine sample for culture since there is the possibility that she has an infection that is resistant to the antibiotics she has been taking intermittently for the past several months.

## 2015-03-04 ENCOUNTER — Other Ambulatory Visit: Payer: Self-pay | Admitting: Gynecologic Oncology

## 2015-03-04 DIAGNOSIS — R300 Dysuria: Secondary | ICD-10-CM

## 2015-03-04 NOTE — Progress Notes (Signed)
Urine culture ordered per Dr. Alycia Rossetti.  Dr. Alycia Rossetti spoke with the patient and informed her about referral to [place] at [facility].

## 2015-03-07 ENCOUNTER — Other Ambulatory Visit: Payer: Federal, State, Local not specified - PPO

## 2015-03-07 DIAGNOSIS — R300 Dysuria: Secondary | ICD-10-CM

## 2015-03-08 ENCOUNTER — Telehealth: Payer: Self-pay | Admitting: Nurse Practitioner

## 2015-03-08 ENCOUNTER — Telehealth: Payer: Self-pay | Admitting: *Deleted

## 2015-03-08 LAB — URINE CULTURE

## 2015-03-08 NOTE — Telephone Encounter (Signed)
Pt called and updated on status of Uro gynecology Referral. Pt has been referred to Uro-GYN @ [facility]  [facility] will contact patient with appointment date and time

## 2015-03-08 NOTE — Telephone Encounter (Signed)
Per Joylene John, NP, patient informed urine culture is negative with no growth. She verbalizes understanding and thanks for the call.

## 2015-04-13 ENCOUNTER — Ambulatory Visit: Payer: Federal, State, Local not specified - PPO | Attending: Gynecologic Oncology | Admitting: Gynecologic Oncology

## 2015-04-13 ENCOUNTER — Ambulatory Visit (HOSPITAL_BASED_OUTPATIENT_CLINIC_OR_DEPARTMENT_OTHER): Payer: Federal, State, Local not specified - PPO

## 2015-04-13 ENCOUNTER — Encounter: Payer: Self-pay | Admitting: Gynecologic Oncology

## 2015-04-13 ENCOUNTER — Other Ambulatory Visit (HOSPITAL_COMMUNITY)
Admission: RE | Admit: 2015-04-13 | Discharge: 2015-04-13 | Disposition: A | Discharge status: Home / Self Care | Payer: Federal, State, Local not specified - PPO | Source: Ambulatory Visit | Attending: Gynecologic Oncology | Admitting: Gynecologic Oncology

## 2015-04-13 VITALS — BP 134/76 | HR 70 | Temp 98.2°F | Resp 18 | Ht 67.0 in | Wt 147.1 lb

## 2015-04-13 DIAGNOSIS — C539 Malignant neoplasm of cervix uteri, unspecified: Secondary | ICD-10-CM

## 2015-04-13 DIAGNOSIS — Z01411 Encounter for gynecological examination (general) (routine) with abnormal findings: Secondary | ICD-10-CM | POA: Insufficient documentation

## 2015-04-13 LAB — URINALYSIS, MICROSCOPIC - CHCC
Bilirubin (Urine): NEGATIVE
Blood: NEGATIVE
GLUCOSE UR CHCC: NEGATIVE mg/dL
Ketones: NEGATIVE mg/dL
Leukocyte Esterase: NEGATIVE
Nitrite: NEGATIVE
PH: 7 (ref 4.6–8.0)
PROTEIN: NEGATIVE mg/dL
RBC / HPF: NEGATIVE (ref 0–2)
SPECIFIC GRAVITY, URINE: 1.015 (ref 1.003–1.035)
Urobilinogen, UR: 0.2 mg/dL (ref 0.2–1)

## 2015-04-13 NOTE — Addendum Note (Signed)
Addended by: Lucile Crater on: 04/13/2015 04:21 PM   Modules accepted: Orders

## 2015-04-13 NOTE — Progress Notes (Signed)
HPI:  Diamond Calhoun is a 44 y.o. year old who is s/p robotic type III radical hysterectomy, bilateral salpingectomy, lymphadenectomy and bilateral oophoropexy (on 01/12/14) for stage IB1 adenocarcinoma of the cervix.   ADDITIONAL INFORMATION: 1. The bilateral fallopian tubes are benign. (JSM:gt, 01/25/14) Vicente Males MD Pathologist, Electronic Signature  Diagnosis 1. Uterus and cervix, Bilateral Tubes - INVASIVE ENDOCERVICAL ADENOCARCINOMA, WELL-DIFFERENTIATED, 1.5 CM. - ASSOCIATED ADENOCARCINOMA IN SITU (AIS). - RESECTION MARGINS ARE NEGATIVE. - TWO OF TWO LYMPH NODES NEGATIVE FOR MALIGNANCY (0/2). - SEE ONCOLOGY TABLE. 2. Lymph nodes, regional resection, Lt Pelvic - EIGHT OF EIGHT LYMPH NODES NEGATIVE FOR MALIGNANCY (0/8). 3. Lymph nodes, regional resection, Rt Pelvic - EIGHT OF EIGHT LYMPH NODES NEGATIVE FOR MALIGNANCY (0/8).  Interval History: [name] in 7/16.  At that time her Pap smear was negative.   She's really been doing much better since last saw her. She has not Herself since early August and has not had any urinary tract infection since that time. Today she feels that there might be a little dysuria but she often feels that goes away on its own. She occasionally has some right-sided pain. For one week she's had a fleeting shooting pain is gone from the vagina to the umbilicus. She's had 3 or 4 episodes are fleeting visually no pattern to it. She and her husband have had intercourse a few times and has not been painful and she's not had any bleeding. She has a history of dyspareunia before her surgery and is apprehensive and feels that she "makes things worse".  Review of Systems  Constitutional: Denies fever. Skin: No rash Cardiovascular: No chest pain, shortness of breath, or edema  Pulmonary: No cough  Gastro Intestinal: Reporting intermittent lower abdominal soreness RLU.  No nausea, vomiting, constipation, or diarrhea reported. No bright red blood per rectum or  change in bowel movement.  Genitourinary: No frequency, urgency, +/-dysuria.  Denies vaginal bleeding and discharge.  Musculoskeletal: No myalgia, arthralgia, joint swelling or pain.  Neurologic: No weakness Psychology: No changes   Current Outpatient Prescriptions on File Prior to Visit  Medication Sig Dispense Refill  . ALPRAZolam (XANAX) 0.25 MG tablet Take 0.125 mg by mouth 2 (two) times daily as needed for anxiety.     . Catheters (DOVER INTERMIT FEMALE CATHETER) MISC 1 Container by Does not apply route 3 (three) times daily. 100 each 6  . CRANBERRY PO Take 1 capsule by mouth daily.    Marland Kitchen ibuprofen (ADVIL,MOTRIN) 200 MG tablet Take 400 mg by mouth every 6 (six) hours as needed (Pain).    Marland Kitchen lidocaine (XYLOCAINE) 2 % jelly Place 1 application into the urethra as needed. With I&O cath per Dr. Alycia Rossetti 30 mL 5   No current facility-administered medications on file prior to visit.   Past Medical History  Diagnosis Date  . Anxiety   . Headache(784.0)     MIGRAINES  . Tinnitus   . Abdominal pain, recurrent     RUQ  AROUND TO BACK  . cervical ca dx'd 12/23/2013   Past Surgical History  Procedure Laterality Date  . Benign tumor removed in 1988  1988    Parotid tumor removed   . Cholecystectomy    . C-secton  12/07/2004  . Robotic assisted total hysterectomy with bilateral salpingo oopherectomy N/A 01/12/2014    Procedure: ROBOTIC ASSISTED LAPAROSCOPIC RADICAL HYSECTOMEY BILATERAL SALPINGECTOMY AND PELVIC LYMPHADENECTOMY;  Surgeon: Imagene Gurney A. Alycia Rossetti, MD;  Location: [facility];  Service: Gynecology;  Laterality:  N/A;   Family History  Problem Relation Age of Onset  . Anemia Brother   . Cancer Paternal Aunt   . Cancer Maternal Grandmother   . Cancer Maternal Grandfather   . Cancer Paternal Grandfather    Social History  Substance Use Topics  . Smoking status: Never Smoker   . Smokeless tobacco: None     Comment: " Smoked some in college"    . Alcohol Use: No     Comment: wine  occasionally     Physical Exam: Blood pressure 134/76, pulse 70, temperature 98.2 F (36.8 C), temperature source Oral, resp. rate 18, height 5\' 7"  (1.702 m), weight 147 lb 1.6 oz (66.724 kg), last menstrual period 01/03/2014, SpO2 100 %. General: Well dressed, well nourished in no apparent distress.    HEENT:  Thyroid is normal size, not nodular, midline.  Skin:  No lesions or rashes.  Lungs:  Clear to auscultation bilaterally.  No wheezes.  Cardiovascular:  Regular rate and rhythm.   Abdomen:  Soft, nontender, nondistended.  No palpable masses.  No hepatosplenomegaly.  No ascites. Normal bowel sounds.  No hernias.    Genitourinary: Normal EGBUS  Vaginal cuff intact.  No bleeding. No fluid in vagina. Vaginal cuff intact and healed. Pap smear performed. Bimanual examination reveals no masses, nodularity, tenderness. Rectal confirms  Extremities: No cyanosis, clubbing or edema.   Psychiatric: Mood and affect are appropriate.  Assessment & Plan:   44 y.o. year old with Stage IB1 cervical adenocarcinoma s/p robotic radical hysterectomy and lymphadenectomy on 9/93/71, complicated by postop urinary retention and overflow incontinence. With respect to her cervical cancer, I discussed that she does not require adjuvant therapy to reduce the risk for recurrence. I discussed that she should undergo close surveillance with every 4  month exams and symptom review. We reviewed symptoms concerning for recurrence (including vaginal bleeding, pelvic or abdominal pains, new lower extremity edema, cough, change in bladder or bowel habit, or weight loss) and the patient was informed to contact us if she notes these symptoms in between her scheduled surveillance visits.   I will follow-up in results of her Pap smear from today notify her of the results. Her bladder really seems to be much better. She does have an appointment with Dr. Anderson Malta on Friday in urogynecology. Hopefully the time from surgery and  her being able to stop the self-catheterization and self diagnosis and treatment of urinary tract infections has helped over time and she is improving. Her overall spirits seem much better today.  She is a low-grade temperature today. We will check a urinalysis.  Nancy Marus A., MD

## 2015-04-13 NOTE — Patient Instructions (Signed)
Plan to follow up with Dr. Alycia Rossetti in four months or sooner if needed.  Please call closer to the date to schedule.  Please call for any questions or concerns.

## 2015-04-14 ENCOUNTER — Telehealth: Payer: Self-pay | Admitting: *Deleted

## 2015-04-14 LAB — URINE CULTURE

## 2015-04-14 NOTE — Telephone Encounter (Signed)
Orders received from [facility] to contact the patient and updated with U/A Microscopic results obtained on 04/13/2015 being Negative . Spoke with Diamond Calhoun and updated her with the  U/A results being negative . Patient states understanding, denies further questions or concerns at this time .

## 2015-04-15 NOTE — Telephone Encounter (Signed)
Per Joylene John , NP patient called to notify of culture and sensitivity being negative. Patient states understanding , denies further questions or concerns at this time.

## 2015-04-18 LAB — CYTOLOGY - PAP

## 2015-04-19 ENCOUNTER — Telehealth: Payer: Self-pay

## 2015-04-19 NOTE — Telephone Encounter (Signed)
Orders received to contact the patient to updated with PAP results being "Negative" from specimen obtained on 04/13/2015 . Patient contacted with results , patient states understanding , denies further questions at this time . Will call with any questions or concerns .

## 2015-07-13 ENCOUNTER — Other Ambulatory Visit (HOSPITAL_COMMUNITY)
Admission: RE | Admit: 2015-07-13 | Discharge: 2015-07-13 | Disposition: A | Discharge status: Home / Self Care | Payer: Federal, State, Local not specified - PPO | Source: Ambulatory Visit | Attending: Gynecologic Oncology | Admitting: Gynecologic Oncology

## 2015-07-13 ENCOUNTER — Ambulatory Visit: Payer: Federal, State, Local not specified - PPO | Attending: Gynecologic Oncology | Admitting: Gynecologic Oncology

## 2015-07-13 ENCOUNTER — Encounter: Payer: Self-pay | Admitting: Gynecologic Oncology

## 2015-07-13 VITALS — BP 126/86 | HR 71 | Temp 98.4°F | Resp 16 | Ht 67.0 in | Wt 147.2 lb

## 2015-07-13 DIAGNOSIS — M545 Low back pain: Secondary | ICD-10-CM

## 2015-07-13 DIAGNOSIS — Z01411 Encounter for gynecological examination (general) (routine) with abnormal findings: Secondary | ICD-10-CM | POA: Insufficient documentation

## 2015-07-13 DIAGNOSIS — C539 Malignant neoplasm of cervix uteri, unspecified: Secondary | ICD-10-CM | POA: Diagnosis not present

## 2015-07-13 DIAGNOSIS — Z8541 Personal history of malignant neoplasm of cervix uteri: Secondary | ICD-10-CM

## 2015-07-13 NOTE — Patient Instructions (Signed)
Plan for your CT scan on Jan 4 at 9 am at [facility].  We will call you with the results of your pap smear.  Plan to follow up in three months or sooner.

## 2015-07-13 NOTE — Progress Notes (Signed)
HPI:  Diamond Calhoun is a 44 y.o. year old who is s/p robotic type III radical hysterectomy, bilateral salpingectomy, lymphadenectomy and bilateral oophoropexy (on 01/12/14) for stage IB1 adenocarcinoma of the cervix.    Diagnosis 1. Uterus and cervix, Bilateral Tubes - INVASIVE ENDOCERVICAL ADENOCARCINOMA, WELL-DIFFERENTIATED, 1.5 CM. - ASSOCIATED ADENOCARCINOMA IN SITU (AIS). - RESECTION MARGINS ARE NEGATIVE. - TWO OF TWO LYMPH NODES NEGATIVE FOR MALIGNANCY (0/2). - SEE ONCOLOGY TABLE. 2. Lymph nodes, regional resection, Lt Pelvic - EIGHT OF EIGHT LYMPH NODES NEGATIVE FOR MALIGNANCY (0/8). 3. Lymph nodes, regional resection, Rt Pelvic - EIGHT OF EIGHT LYMPH NODES NEGATIVE FOR MALIGNANCY (0/8).  Interval History: [name] in 9/16.  At that time her Pap smear was negative.  She saw Dr. Liz Beach in uro gynecology at [facility]. At that time she was examined and had a post void residual that was within normal limits. She was reassured by that visit. She has not catheterized herself at all since then. She will Valsalva typically once a day and usually at night after voiding to make sure that her bladder is empty. She continues to have a persistent right lower quadrant pain that she's had for quite some time but now it appears to be moving across her lower abdomen. She states that her abdomen feels "puffy" and "fluffy". She's also experiencing some low back pain. She continues to have intermittent diarrhea and constipation which is been long-standing. She occasionally feels a sharp pain near the vagina and feels some cramping pain in the vagina as well. She pretty much experiences pain every day per her report and this is been 1 on for 3 months. The pain is random she's been tracking and it does not appear to be related to ovulation. The pain never wakes her up at night. There is no bleeding.  Review of Systems  Constitutional: Denies fever. Skin: No rash Cardiovascular: No chest pain,  shortness of breath, or edema  Pulmonary: No cough  Gastro Intestinal: Reporting intermittent lower abdominal soreness as above.  No nausea, vomiting, constipation, or diarrhea reported. No bright red blood per rectum or change in bowel movement.  Genitourinary: No frequency, urgency, -dysuria.  Denies vaginal bleeding and discharge.  Musculoskeletal: No myalgia, arthralgia, joint swelling or pain. + LBP as above Neurologic: No weakness Psychology: No changes   Current Outpatient Prescriptions on File Prior to Visit  Medication Sig Dispense Refill  . ibuprofen (ADVIL,MOTRIN) 200 MG tablet Take 400 mg by mouth every 6 (six) hours as needed (Pain).    Marland Kitchen ALPRAZolam (XANAX) 0.25 MG tablet Take 0.125 mg by mouth 2 (two) times daily as needed for anxiety. Reported on 07/13/2015    . lidocaine (XYLOCAINE) 2 % jelly Place 1 application into the urethra as needed. With I&O cath per Dr. Alycia Rossetti (Patient not taking: Reported on 07/13/2015) 30 mL 5   No current facility-administered medications on file prior to visit.   Past Medical History  Diagnosis Date  . Anxiety   . Headache(784.0)     MIGRAINES  . Tinnitus   . Abdominal pain, recurrent     RUQ  AROUND TO BACK  . cervical ca dx'd 12/23/2013   Past Surgical History  Procedure Laterality Date  . Benign tumor removed in 1988  1988    Parotid tumor removed   . Cholecystectomy    . C-secton  12/07/2004  . Robotic assisted total hysterectomy with bilateral salpingo oopherectomy N/A 01/12/2014    Procedure: ROBOTIC ASSISTED LAPAROSCOPIC  RADICAL HYSECTOMEY BILATERAL SALPINGECTOMY AND PELVIC LYMPHADENECTOMY;  Surgeon: Imagene Gurney A. Alycia Rossetti, MD;  Location: [facility];  Service: Gynecology;  Laterality: N/A;   Family History  Problem Relation Age of Onset  . Anemia Brother   . Cancer Paternal Aunt   . Cancer Maternal Grandmother   . Cancer Maternal Grandfather   . Cancer Paternal Grandfather    Social History  Substance Use Topics  . Smoking status:  Never Smoker   . Smokeless tobacco: None     Comment: " Smoked some in college"    . Alcohol Use: No     Comment: wine occasionally     Physical Exam: Blood pressure 126/86, pulse 71, temperature 98.4 F (36.9 C), temperature source Oral, resp. rate 16, height 5\' 7"  (1.702 m), weight 147 lb 3.2 oz (66.769 kg), last menstrual period 01/03/2014. General: Well dressed, well nourished in no apparent distress.    HEENT:  Thyroid is normal size, not nodular, midline.  Skin:  No lesions or rashes.  Lungs:  Clear to auscultation bilaterally.  No wheezes.  Cardiovascular:  Regular rate and rhythm.   Abdomen:  Soft, nontender, nondistended.  No palpable masses.  No hepatosplenomegaly.  No ascites. Normal bowel sounds.  No hernias.    Genitourinary: Normal EGBUS  Vaginal cuff intact.  No bleeding. No fluid in vagina. Vaginal cuff intact and healed. Pap smear performed. Bimanual examination reveals no masses, nodularity, tenderness. Rectal confirms  Extremities: No cyanosis, clubbing or edema.   Psychiatric: Mood and affect are appropriate.  Assessment & Plan:   44 y.o. year old with Stage IB1 cervical adenocarcinoma s/p robotic radical hysterectomy and lymphadenectomy on [id number], complicated by postop urinary retention and overflow incontinence. With respect to her cervical cancer, I discussed that she does not require adjuvant therapy to reduce the risk for recurrence. I discussed that she should undergo close surveillance with every 4  month exams and symptom review. We reviewed symptoms concerning for recurrence (including vaginal bleeding, pelvic or abdominal pains, new lower extremity edema, cough, change in bladder or bowel habit, or weight loss) and the patient was informed to contact us if she notes these symptoms in between her scheduled surveillance visits.   I will follow-up in results of her Pap smear from today notify her of the results.   Her bladder really seems to be much  better.  Because of this persistent pain that she's been having for the past 3 months even though her exam is reassuring and will proceed with a CT scan. We'll schedule this for after the holidays and she'll be traveling. I will call her with the results. She'll return to see me in 3 months.  Nancy Marus A., MD

## 2015-07-15 LAB — CYTOLOGY - PAP

## 2015-07-20 ENCOUNTER — Telehealth: Payer: Self-pay

## 2015-07-20 NOTE — Telephone Encounter (Signed)
Orders received from [facility], APNP to contact the patient to update with PAP results obtained on Jul 13, 2015 during her visit with Dr Alycia Rossetti was "normal" . Attempted to contact the patient , no answer , left a detailed message with PAP results being "normal" obtained during her visit with Dr Alycia Rossetti . Call back numbers provided if additional questions arise .

## 2015-07-27 ENCOUNTER — Encounter (HOSPITAL_COMMUNITY): Payer: Self-pay

## 2015-07-27 ENCOUNTER — Ambulatory Visit (HOSPITAL_COMMUNITY)
Admission: RE | Admit: 2015-07-27 | Discharge: 2015-07-27 | Disposition: A | Discharge status: Home / Self Care | Payer: Federal, State, Local not specified - PPO | Source: Ambulatory Visit | Attending: Gynecologic Oncology | Admitting: Gynecologic Oncology

## 2015-07-27 DIAGNOSIS — Z8541 Personal history of malignant neoplasm of cervix uteri: Secondary | ICD-10-CM | POA: Insufficient documentation

## 2015-07-27 DIAGNOSIS — Z90710 Acquired absence of both cervix and uterus: Secondary | ICD-10-CM | POA: Insufficient documentation

## 2015-07-27 DIAGNOSIS — Z9049 Acquired absence of other specified parts of digestive tract: Secondary | ICD-10-CM | POA: Insufficient documentation

## 2015-07-27 DIAGNOSIS — C539 Malignant neoplasm of cervix uteri, unspecified: Secondary | ICD-10-CM

## 2015-07-27 MED ORDER — IOHEXOL 300 MG/ML  SOLN
100.0000 mL | Freq: Once | INTRAMUSCULAR | Status: AC | PRN
  Administered 2015-07-27: 100 mL via INTRAVENOUS

## 2015-08-04 ENCOUNTER — Encounter: Payer: Self-pay | Admitting: Gynecologic Oncology

## 2015-09-16 ENCOUNTER — Encounter: Payer: Self-pay | Admitting: Gynecologic Oncology

## 2015-09-28 ENCOUNTER — Ambulatory Visit: Payer: Federal, State, Local not specified - PPO | Admitting: Gynecologic Oncology

## 2015-11-02 ENCOUNTER — Other Ambulatory Visit (HOSPITAL_COMMUNITY)
Admission: RE | Admit: 2015-11-02 | Discharge: 2015-11-02 | Disposition: A | Discharge status: Home / Self Care | Payer: Federal, State, Local not specified - PPO | Source: Ambulatory Visit | Attending: Gynecologic Oncology | Admitting: Gynecologic Oncology

## 2015-11-02 ENCOUNTER — Other Ambulatory Visit: Payer: Self-pay | Admitting: Gynecologic Oncology

## 2015-11-02 ENCOUNTER — Encounter: Payer: Self-pay | Admitting: Gynecologic Oncology

## 2015-11-02 ENCOUNTER — Ambulatory Visit: Payer: Federal, State, Local not specified - PPO | Attending: Gynecologic Oncology | Admitting: Gynecologic Oncology

## 2015-11-02 VITALS — BP 116/82 | HR 74 | Temp 98.5°F | Resp 18 | Ht 67.0 in | Wt 150.0 lb

## 2015-11-02 DIAGNOSIS — C539 Malignant neoplasm of cervix uteri, unspecified: Secondary | ICD-10-CM

## 2015-11-02 DIAGNOSIS — Z809 Family history of malignant neoplasm, unspecified: Secondary | ICD-10-CM | POA: Insufficient documentation

## 2015-11-02 DIAGNOSIS — F419 Anxiety disorder, unspecified: Secondary | ICD-10-CM | POA: Diagnosis not present

## 2015-11-02 DIAGNOSIS — C530 Malignant neoplasm of endocervix: Secondary | ICD-10-CM | POA: Diagnosis not present

## 2015-11-02 DIAGNOSIS — Z90710 Acquired absence of both cervix and uterus: Secondary | ICD-10-CM | POA: Diagnosis not present

## 2015-11-02 DIAGNOSIS — C531 Malignant neoplasm of exocervix: Secondary | ICD-10-CM

## 2015-11-02 DIAGNOSIS — Z01411 Encounter for gynecological examination (general) (routine) with abnormal findings: Secondary | ICD-10-CM | POA: Insufficient documentation

## 2015-11-02 DIAGNOSIS — Z79899 Other long term (current) drug therapy: Secondary | ICD-10-CM | POA: Insufficient documentation

## 2015-11-02 NOTE — Patient Instructions (Signed)
I will call you with the results of your CT scan. We will also notify you of the results of your Pap smear. Happy to year anniversary in advance. I will see you in October.

## 2015-11-02 NOTE — Progress Notes (Signed)
HPI:  Diamond Calhoun is a 45 y.o. year old who is s/p robotic type III radical hysterectomy, bilateral salpingectomy, lymphadenectomy and bilateral oophoropexy (on 01/12/14) for stage IB1 adenocarcinoma of the cervix.    Diagnosis 1. Uterus and cervix, Bilateral Tubes - INVASIVE ENDOCERVICAL ADENOCARCINOMA, WELL-DIFFERENTIATED, 1.5 CM. - ASSOCIATED ADENOCARCINOMA IN SITU (AIS). - RESECTION MARGINS ARE NEGATIVE. - TWO OF TWO LYMPH NODES NEGATIVE FOR MALIGNANCY (0/2). - SEE ONCOLOGY TABLE. 2. Lymph nodes, regional resection, Lt Pelvic - EIGHT OF EIGHT LYMPH NODES NEGATIVE FOR MALIGNANCY (0/8). 3. Lymph nodes, regional resection, Rt Pelvic - EIGHT OF EIGHT LYMPH NODES NEGATIVE FOR MALIGNANCY (0/8).  Interval History: She saw Dr. Liz Beach in [place] gynecology at [facility]. At that time she was examined and had a post void residual that was within normal limits. She was reassured by that visit. She has not catheterized herself at all since then. She will Valsalva typically once a day and usually at night after voiding to make sure that her bladder is empty.   [name] in 12/16.  At that time her Pap smear was negative. She was having persistent issues with abdominal pain. Therefore, CT scan of the abdomen and pelvis was ordered.  IMPRESSION: 1. No definite acute findings to account for the patient's symptoms. 2. Status post total abdominal hysterectomy and bilateral salpingectomy with bilateral ovariopexy, as above. No findings to suggest local recurrence of disease. Along the right pelvic side wall in the region of the right external iliac nodal distribution there is a 12 mm short axis low-attenuation structure. While this could represent a necrotic lymph node, this may alternatively represent a resolving postoperative seroma or small lymphangioma. Attention on follow-up imaging is recommended to ensure stability or resolution of this finding. No other definite signs to suggest  metastatic  disease in the abdomen or pelvis. 3. Additional incidental findings, as above.  We discuss these results. Since then the pain is completely resolved and she's somewhat forgot that she's eating habits. Last week she had an episode of right-sided pain that did last for a few days. She denies any vaginal bleeding. Bowel habits are fine. She states that her bladder is stable. About twice a day she will Valsalva to have increased urine come out. It's always a smaller void without which she emptied spontaneously. She's had no UTI symptoms.   Review of Systems  Constitutional: Denies fever. Pulmonary: No cough  Gastro Intestinal:  No nausea, vomiting, constipation, or diarrhea reported. No change in bowel movement.  Genitourinary: No frequency, urgency, -dysuria.  Denies vaginal bleeding and discharge.  Musculoskeletal: No myalgia, arthralgia, joint swelling or pain.  Neurologic: No weakness Psychology: No changes   Current Outpatient Prescriptions on File Prior to Visit  Medication Sig Dispense Refill  . ALPRAZolam (XANAX) 0.25 MG tablet Take 0.125 mg by mouth 2 (two) times daily as needed for anxiety. Reported on 07/13/2015    . ibuprofen (ADVIL,MOTRIN) 200 MG tablet Take 400 mg by mouth every 6 (six) hours as needed (Pain).    Marland Kitchen ibuprofen (ADVIL,MOTRIN) 200 MG tablet Take 200 mg by mouth.    . Multiple Vitamins-Minerals (MULTIVITAMIN GUMMIES WOMENS) CHEW Chew by mouth.     No current facility-administered medications on file prior to visit.   Past Medical History  Diagnosis Date  . Anxiety   . Headache(784.0)     MIGRAINES  . Tinnitus   . Abdominal pain, recurrent     RUQ  AROUND TO BACK  .  cervical ca dx'd 12/23/2013   Past Surgical History  Procedure Laterality Date  . Benign tumor removed in 1988  1988    Parotid tumor removed   . Cholecystectomy    . C-secton  12/07/2004  . Robotic assisted total hysterectomy with bilateral salpingo oopherectomy N/A 01/12/2014    Procedure:  ROBOTIC ASSISTED LAPAROSCOPIC RADICAL HYSECTOMEY BILATERAL SALPINGECTOMY AND PELVIC LYMPHADENECTOMY;  Surgeon: Imagene Gurney A. Alycia Rossetti, MD;  Location: [facility];  Service: Gynecology;  Laterality: N/A;   Family History  Problem Relation Age of Onset  . Anemia Brother   . Cancer Paternal Aunt   . Cancer Maternal Grandmother   . Cancer Maternal Grandfather   . Cancer Paternal Grandfather    Social History  Substance Use Topics  . Smoking status: Never Smoker   . Smokeless tobacco: None     Comment: " Smoked some in college"    . Alcohol Use: No     Comment: wine occasionally     Physical Exam: Blood pressure 116/82, pulse 74, temperature 98.5 F (36.9 C), temperature source Oral, resp. rate 18, height 5\' 7"  (1.702 m), weight 150 lb (68.04 kg), last menstrual period 01/03/2014, SpO2 100 %. General: Well dressed, well nourished in no apparent distress.    HEENT:  Thyroid is normal size, not nodular, midline.  Skin:  No lesions or rashes.  Lungs:  Clear to auscultation bilaterally.  No wheezes.  Cardiovascular:  Regular rate and rhythm.   Abdomen:  Soft, nontender, nondistended.  No palpable masses.  No hepatosplenomegaly.  No ascites. Normal bowel sounds.  No hernias.    Groins: No lymphadenopathy  Genitourinary: Normal EGBUS  Vaginal cuff intact.  No bleeding. No fluid in vagina. Vaginal cuff intact and healed. Pap smear performed. Bimanual examination reveals no masses, nodularity, tenderness. Rectal confirms  Extremities: No cyanosis, clubbing or edema.   Psychiatric: Mood and affect are appropriate.  Assessment & Plan:   45 y.o. year old with Stage IB1 cervical adenocarcinoma s/p robotic radical hysterectomy and lymphadenectomy on 123456, complicated by postop urinary retention and overflow incontinence. With respect to her cervical cancer, I discussed that she does not require adjuvant therapy to reduce the risk for recurrence. I discussed that she should undergo close surveillance  with every 4  month exams and symptom review. As she is almost 2 years out from her diagnosis we will go to 6 months based on the SGO guidelines. We reviewed symptoms concerning for recurrence (including vaginal bleeding, pelvic or abdominal pains, new lower extremity edema, cough, change in bladder or bowel habit, or weight loss) and the patient was informed to contact us if she notes these symptoms in between her scheduled surveillance visits.   I will follow-up in results of her Pap smear from today notify her of the results. I will call her with her CT scan in July. She return to see me in 6 months.   Nancy Marus A., MD

## 2015-11-04 LAB — CYTOLOGY - PAP

## 2015-11-07 ENCOUNTER — Telehealth: Payer: Self-pay | Admitting: Gynecologic Oncology

## 2015-11-07 NOTE — Telephone Encounter (Signed)
Pt notified about pap results: negative.  No questions or concerns voiced. 

## 2015-12-01 ENCOUNTER — Other Ambulatory Visit: Payer: Self-pay | Admitting: Family Medicine

## 2015-12-01 ENCOUNTER — Ambulatory Visit
Admission: RE | Admit: 2015-12-01 | Discharge: 2015-12-01 | Disposition: A | Discharge status: Home / Self Care | Payer: Federal, State, Local not specified - PPO | Source: Ambulatory Visit | Attending: Family Medicine | Admitting: Family Medicine

## 2015-12-01 DIAGNOSIS — R109 Unspecified abdominal pain: Secondary | ICD-10-CM

## 2015-12-01 MED ORDER — IOPAMIDOL (ISOVUE-300) INJECTION 61%
100.0000 mL | Freq: Once | INTRAVENOUS | Status: AC | PRN
  Administered 2015-12-01: 100 mL via INTRAVENOUS

## 2016-01-23 ENCOUNTER — Encounter: Payer: Self-pay | Admitting: Gynecologic Oncology

## 2016-01-25 ENCOUNTER — Telehealth: Payer: Self-pay | Admitting: Gynecologic Oncology

## 2016-01-25 NOTE — Telephone Encounter (Signed)
Returned call to patient.  Appt in October cancelled since patient will be moving near [place].  Dr. Alycia Rossetti stating patient does not need CT scan scheduled for tomorrow since she had one in May and nothing concerning seen per Dr. Alycia Rossetti.  Patient advised to call for any questions or concerns or to assist with facilitating a new pt referral with another GYN ONC.

## 2016-01-26 ENCOUNTER — Ambulatory Visit (HOSPITAL_COMMUNITY): Payer: Federal, State, Local not specified - PPO

## 2016-04-25 ENCOUNTER — Ambulatory Visit: Payer: Federal, State, Local not specified - PPO | Admitting: Gynecologic Oncology

## 2017-01-04 IMAGING — US US PELVIS COMPLETE
1 series · 13 of 25 positions shown · non-contrast
Comparison: CT 03/15/2014

CLINICAL DATA: Patient with right lower quadrant pain for 10
months. Prior hysterectomy and salpingo-oophorectomy.

EXAM:
TRANSABDOMINAL AND TRANSVAGINAL ULTRASOUND OF PELVIS
TECHNIQUE: Both transabdominal and transvaginal ultrasound examinations of the
pelvis were performed. Transabdominal technique was performed for
global imaging of the pelvis including uterus, ovaries, adnexal
regions, and pelvic cul-de-sac. It was necessary to proceed with
endovaginal exam following the transabdominal exam to visualize the
adnexal structures.

[Series 1: us pelvis complete · 0.24mm/px · 13 of 80 slices shown]
[im 1/80]
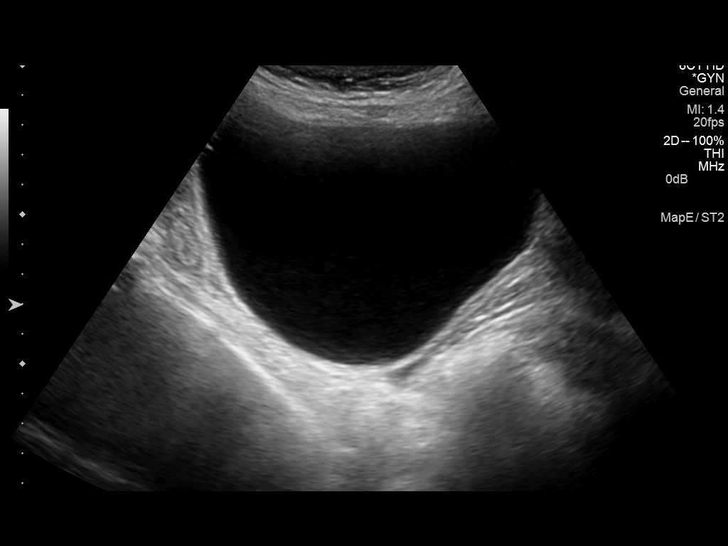
[im 7/80]
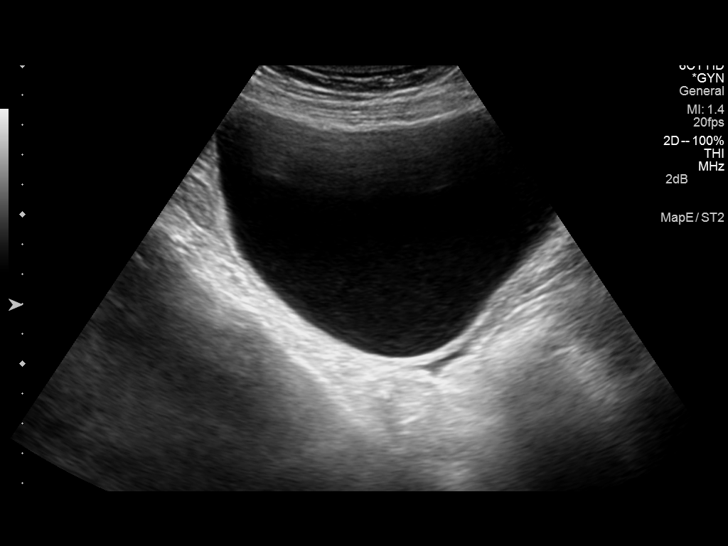
[im 14/80]
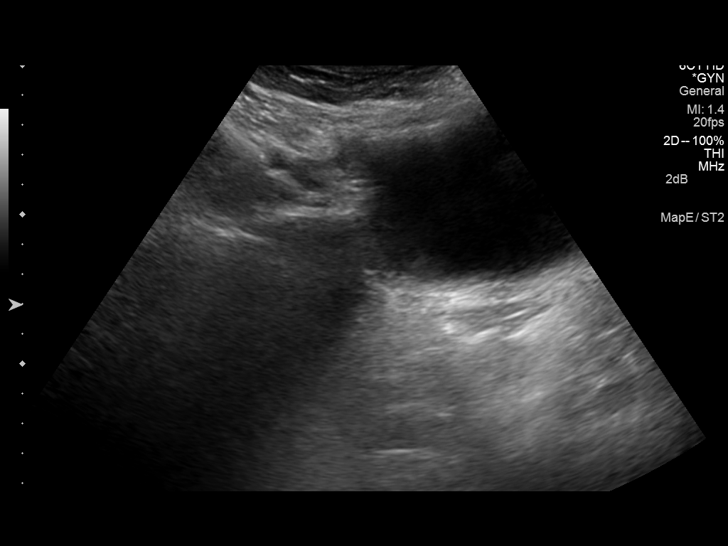
[im 20/80]
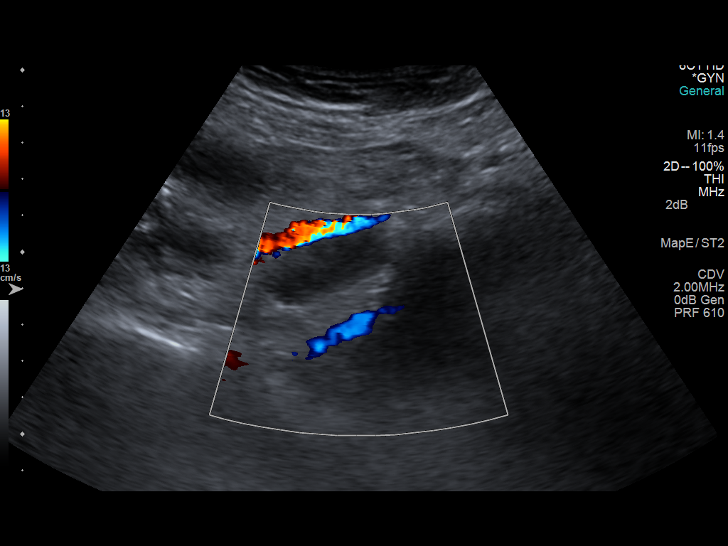
[im 27/80]
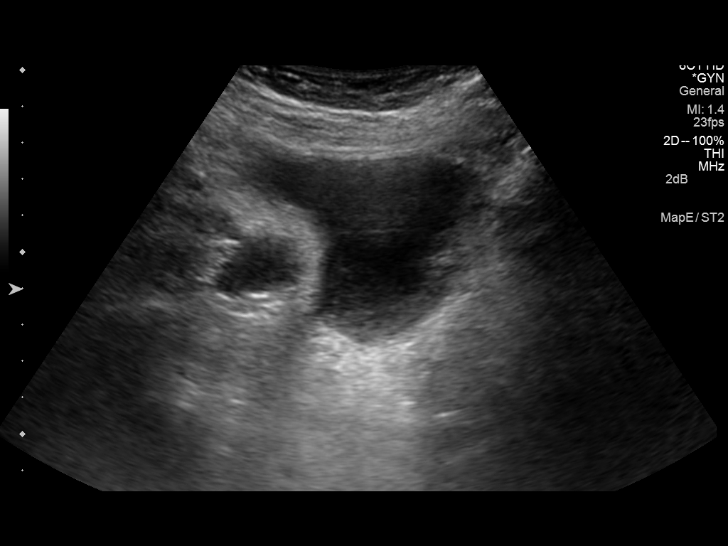
[im 33/80]
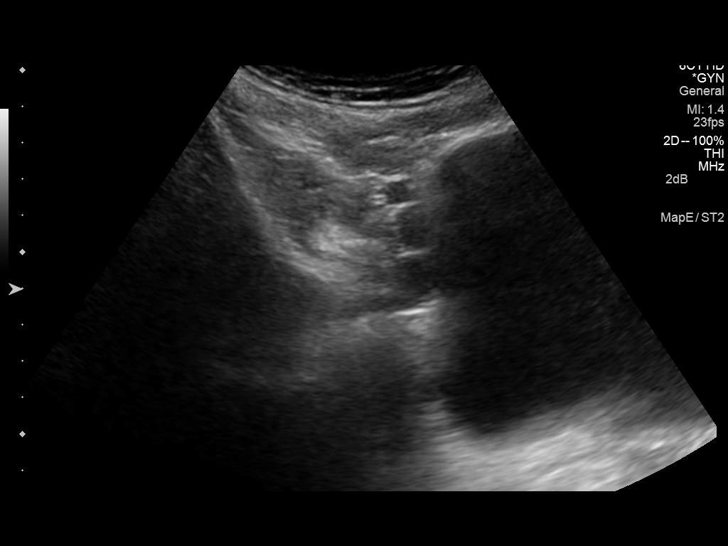
[im 40/80]
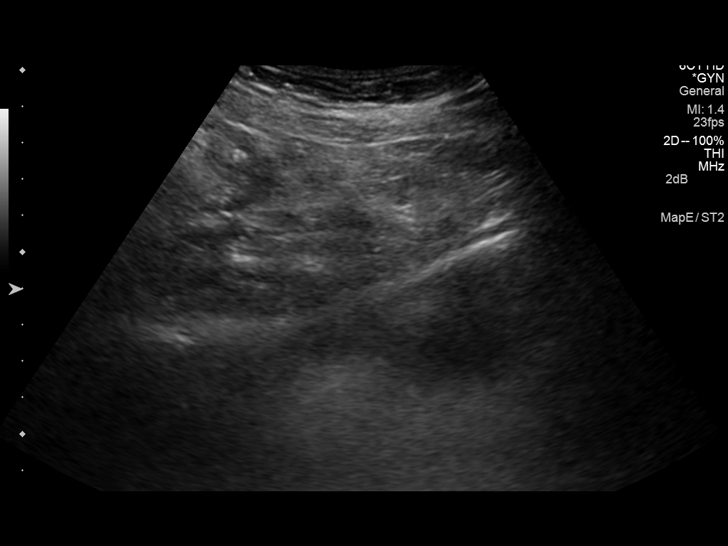
[im 47/80]
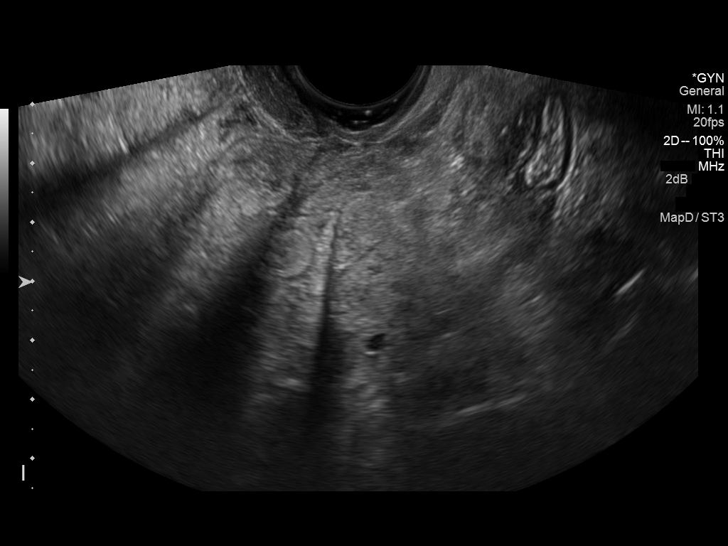
[im 53/80]
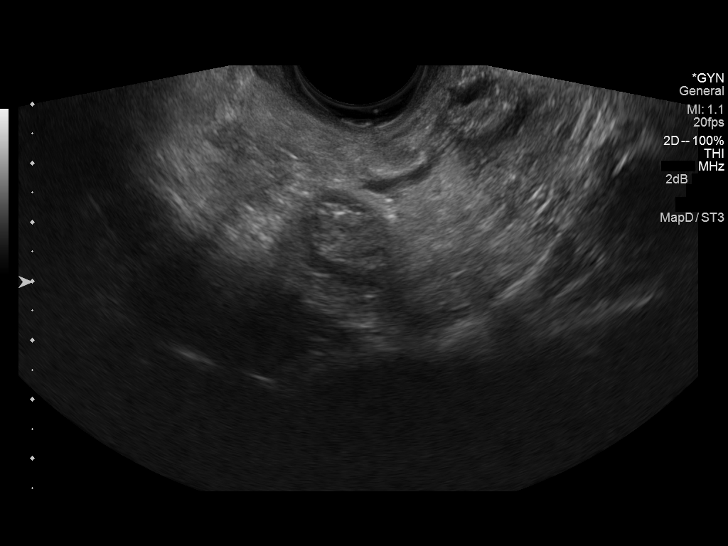
[im 60/80]
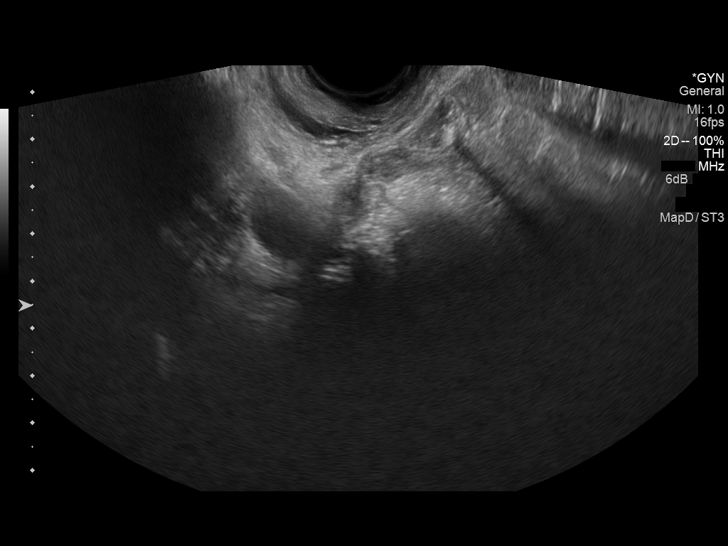
[im 66/80]
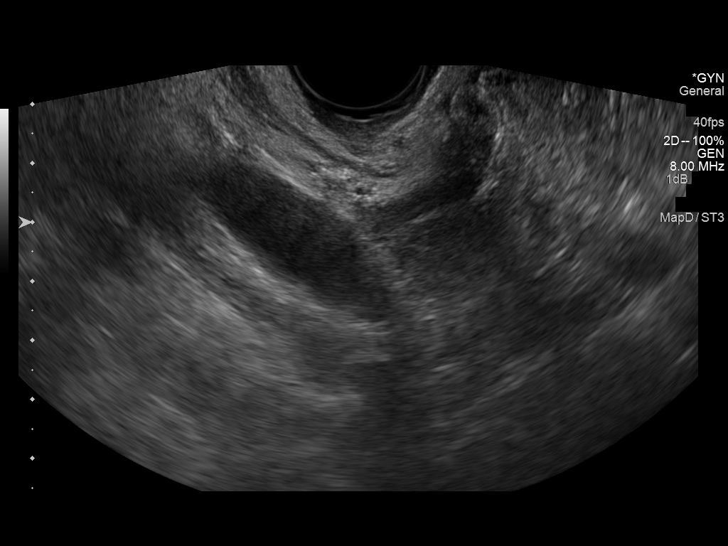
[im 73/80]
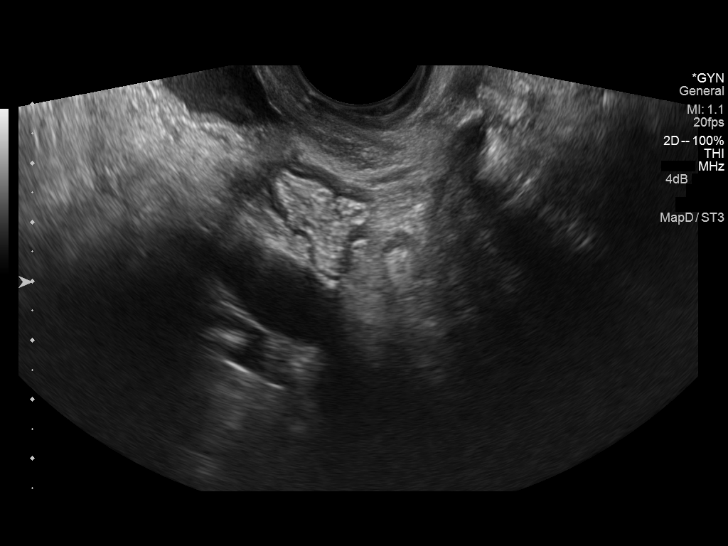
[im 80/80]
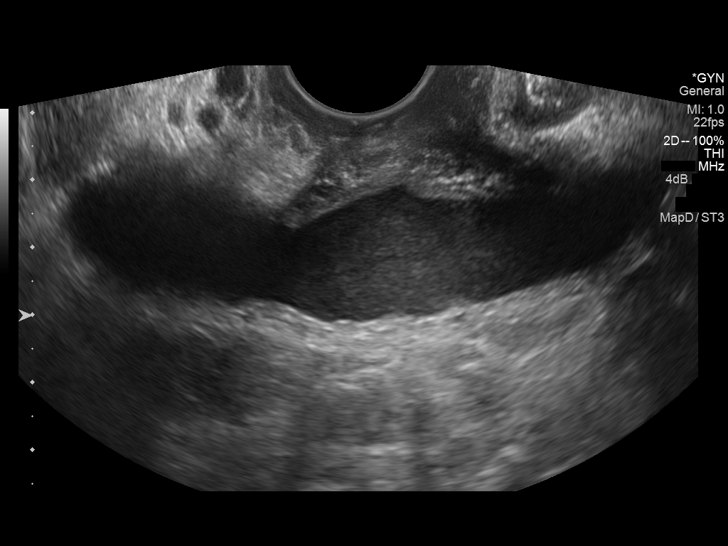

[13 of 25 positions shown; findings below may reference images not displayed]

FINDINGS: Uterus

Surgically absent.

Right ovary

Not visualized.

Left ovary

Not visualized.

Other findings

Small amount of free fluid. Within the right adnexa there is a 4.1 x
1.4 x 1.4 cm oval hypoechoic mass without internal color vascular
blood flow. Debris is demonstrated within the bladder.
IMPRESSION: 1. 4.1 cm hypoechoic mass within the right adnexa is nonspecific in
etiology. This may be secondary to a postoperative seroma or
lymphocele however adenopathy is not excluded. Consider further
evaluation with contrast-enhanced pelvic CT to exclude the
possibility of underlying adenopathy given history of cervical
malignancy.
2. Debris within the urinary bladder. Recommend correlation with
urinalysis to exclude infection.

## 2017-06-21 ENCOUNTER — Telehealth: Payer: Self-pay | Admitting: Gynecologic Oncology

## 2017-06-21 NOTE — Telephone Encounter (Signed)
Returned call to patient.  She wanted to touch base with Dr. Alycia Rossetti about receiving maintenance Avastin.  She has moved to [facility] and has a new oncologist there.  Current update on her situation below:  She said she had a pain in her leg and went to her oncologist.  She ordered a PET scan that actually showed an area in her lung.  She said she had it biopsied and it was her cervical cancer (specimen was sent to [facility] for path review).  She had a lung resection followed by chemo,which she finished 6 weeks ago.  She had a clear PET scan after treatment but her oncologist had discussed maintenance Avastin indefinitely.   Patient informed that Dr. Alycia Rossetti had reviewed the above and agreed with the Avastin until side effects or progression.  Patient thankful for her input.  Advised to call for any further questions or concerns.
